# Patient Record
Sex: Female | Born: 1995
Health system: Southern US, Community
[De-identification: ages and names within clinical notes are randomized; demographics above are authoritative.]

## PROBLEM LIST (undated history)

## (undated) DIAGNOSIS — Z8489 Family history of other specified conditions: Secondary | ICD-10-CM

## (undated) DIAGNOSIS — E079 Disorder of thyroid, unspecified: Secondary | ICD-10-CM

---
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
## 2017-05-16 DIAGNOSIS — Z111 Encounter for screening for respiratory tuberculosis: Secondary | ICD-10-CM | POA: Diagnosis not present

## 2017-05-16 DIAGNOSIS — Z020 Encounter for examination for admission to educational institution: Secondary | ICD-10-CM | POA: Diagnosis not present

## 2017-06-13 DIAGNOSIS — Z13220 Encounter for screening for lipoid disorders: Secondary | ICD-10-CM | POA: Diagnosis not present

## 2017-06-13 DIAGNOSIS — Z23 Encounter for immunization: Secondary | ICD-10-CM | POA: Diagnosis not present

## 2017-06-13 DIAGNOSIS — R946 Abnormal results of thyroid function studies: Secondary | ICD-10-CM | POA: Diagnosis not present

## 2017-06-13 DIAGNOSIS — Z0000 Encounter for general adult medical examination without abnormal findings: Secondary | ICD-10-CM | POA: Diagnosis not present

## 2017-06-19 DIAGNOSIS — R946 Abnormal results of thyroid function studies: Secondary | ICD-10-CM | POA: Diagnosis not present

## 2017-11-08 DIAGNOSIS — F321 Major depressive disorder, single episode, moderate: Secondary | ICD-10-CM | POA: Diagnosis not present

## 2017-11-15 DIAGNOSIS — F321 Major depressive disorder, single episode, moderate: Secondary | ICD-10-CM | POA: Diagnosis not present

## 2017-11-28 DIAGNOSIS — F321 Major depressive disorder, single episode, moderate: Secondary | ICD-10-CM | POA: Diagnosis not present

## 2017-12-17 DIAGNOSIS — F321 Major depressive disorder, single episode, moderate: Secondary | ICD-10-CM | POA: Diagnosis not present

## 2018-01-07 DIAGNOSIS — F4321 Adjustment disorder with depressed mood: Secondary | ICD-10-CM | POA: Diagnosis not present

## 2018-01-09 DIAGNOSIS — F4321 Adjustment disorder with depressed mood: Secondary | ICD-10-CM | POA: Diagnosis not present

## 2018-01-16 DIAGNOSIS — F4321 Adjustment disorder with depressed mood: Secondary | ICD-10-CM | POA: Diagnosis not present

## 2018-01-23 DIAGNOSIS — R946 Abnormal results of thyroid function studies: Secondary | ICD-10-CM | POA: Diagnosis not present

## 2018-01-23 DIAGNOSIS — R5383 Other fatigue: Secondary | ICD-10-CM | POA: Diagnosis not present

## 2018-01-23 DIAGNOSIS — E6609 Other obesity due to excess calories: Secondary | ICD-10-CM | POA: Diagnosis not present

## 2018-01-23 DIAGNOSIS — N926 Irregular menstruation, unspecified: Secondary | ICD-10-CM | POA: Diagnosis not present

## 2018-01-30 DIAGNOSIS — F4321 Adjustment disorder with depressed mood: Secondary | ICD-10-CM | POA: Diagnosis not present

## 2018-02-08 DIAGNOSIS — F4321 Adjustment disorder with depressed mood: Secondary | ICD-10-CM | POA: Diagnosis not present

## 2018-02-13 DIAGNOSIS — F4321 Adjustment disorder with depressed mood: Secondary | ICD-10-CM | POA: Diagnosis not present

## 2018-03-01 DIAGNOSIS — F4321 Adjustment disorder with depressed mood: Secondary | ICD-10-CM | POA: Diagnosis not present

## 2018-03-11 DIAGNOSIS — F4321 Adjustment disorder with depressed mood: Secondary | ICD-10-CM | POA: Diagnosis not present

## 2018-05-28 DIAGNOSIS — F4321 Adjustment disorder with depressed mood: Secondary | ICD-10-CM | POA: Diagnosis not present

## 2018-06-07 DIAGNOSIS — F4321 Adjustment disorder with depressed mood: Secondary | ICD-10-CM | POA: Diagnosis not present

## 2018-06-17 ENCOUNTER — Other Ambulatory Visit (HOSPITAL_COMMUNITY)
Admission: RE | Admit: 2018-06-17 | Discharge: 2018-06-17 | Disposition: A | Discharge status: Home / Self Care | Payer: BC Managed Care – PPO | Source: Ambulatory Visit | Attending: Family Medicine | Admitting: Family Medicine

## 2018-06-17 ENCOUNTER — Other Ambulatory Visit: Payer: Self-pay | Admitting: Family Medicine

## 2018-06-17 DIAGNOSIS — Z13220 Encounter for screening for lipoid disorders: Secondary | ICD-10-CM | POA: Diagnosis not present

## 2018-06-17 DIAGNOSIS — N912 Amenorrhea, unspecified: Secondary | ICD-10-CM | POA: Diagnosis not present

## 2018-06-17 DIAGNOSIS — Z0000 Encounter for general adult medical examination without abnormal findings: Secondary | ICD-10-CM | POA: Diagnosis not present

## 2018-06-17 DIAGNOSIS — Z124 Encounter for screening for malignant neoplasm of cervix: Secondary | ICD-10-CM | POA: Insufficient documentation

## 2018-06-17 DIAGNOSIS — R946 Abnormal results of thyroid function studies: Secondary | ICD-10-CM | POA: Diagnosis not present

## 2018-06-17 DIAGNOSIS — Z111 Encounter for screening for respiratory tuberculosis: Secondary | ICD-10-CM | POA: Diagnosis not present

## 2018-06-17 DIAGNOSIS — R7989 Other specified abnormal findings of blood chemistry: Secondary | ICD-10-CM | POA: Diagnosis not present

## 2018-06-20 LAB — CYTOLOGY - PAP: Diagnosis: NEGATIVE

## 2018-06-25 DIAGNOSIS — F4321 Adjustment disorder with depressed mood: Secondary | ICD-10-CM | POA: Diagnosis not present

## 2018-07-02 DIAGNOSIS — F4321 Adjustment disorder with depressed mood: Secondary | ICD-10-CM | POA: Diagnosis not present

## 2018-07-16 DIAGNOSIS — R635 Abnormal weight gain: Secondary | ICD-10-CM | POA: Diagnosis not present

## 2018-07-16 DIAGNOSIS — E282 Polycystic ovarian syndrome: Secondary | ICD-10-CM | POA: Diagnosis not present

## 2018-07-16 DIAGNOSIS — N912 Amenorrhea, unspecified: Secondary | ICD-10-CM | POA: Diagnosis not present

## 2018-07-17 DIAGNOSIS — F4321 Adjustment disorder with depressed mood: Secondary | ICD-10-CM | POA: Diagnosis not present

## 2018-07-29 DIAGNOSIS — F4321 Adjustment disorder with depressed mood: Secondary | ICD-10-CM | POA: Diagnosis not present

## 2018-08-13 DIAGNOSIS — F4321 Adjustment disorder with depressed mood: Secondary | ICD-10-CM | POA: Diagnosis not present

## 2018-09-19 DIAGNOSIS — F4321 Adjustment disorder with depressed mood: Secondary | ICD-10-CM | POA: Diagnosis not present

## 2018-10-03 DIAGNOSIS — F4321 Adjustment disorder with depressed mood: Secondary | ICD-10-CM | POA: Diagnosis not present

## 2018-10-23 DIAGNOSIS — F4321 Adjustment disorder with depressed mood: Secondary | ICD-10-CM | POA: Diagnosis not present

## 2018-11-06 DIAGNOSIS — F4321 Adjustment disorder with depressed mood: Secondary | ICD-10-CM | POA: Diagnosis not present

## 2018-11-20 DIAGNOSIS — R7989 Other specified abnormal findings of blood chemistry: Secondary | ICD-10-CM | POA: Diagnosis not present

## 2018-11-20 DIAGNOSIS — E669 Obesity, unspecified: Secondary | ICD-10-CM | POA: Diagnosis not present

## 2018-11-20 DIAGNOSIS — E221 Hyperprolactinemia: Secondary | ICD-10-CM | POA: Diagnosis not present

## 2018-11-20 DIAGNOSIS — N912 Amenorrhea, unspecified: Secondary | ICD-10-CM | POA: Diagnosis not present

## 2018-11-20 DIAGNOSIS — E282 Polycystic ovarian syndrome: Secondary | ICD-10-CM | POA: Diagnosis not present

## 2018-11-20 DIAGNOSIS — E02 Subclinical iodine-deficiency hypothyroidism: Secondary | ICD-10-CM | POA: Diagnosis not present

## 2018-11-25 ENCOUNTER — Other Ambulatory Visit: Payer: Self-pay | Admitting: Endocrinology

## 2018-11-25 ENCOUNTER — Other Ambulatory Visit: Payer: Self-pay | Admitting: Nurse Practitioner

## 2018-11-26 ENCOUNTER — Other Ambulatory Visit: Payer: Self-pay | Admitting: Endocrinology

## 2018-11-26 DIAGNOSIS — E221 Hyperprolactinemia: Secondary | ICD-10-CM

## 2018-12-04 ENCOUNTER — Other Ambulatory Visit: Payer: Self-pay

## 2018-12-04 DIAGNOSIS — Z20822 Contact with and (suspected) exposure to covid-19: Secondary | ICD-10-CM

## 2018-12-04 DIAGNOSIS — F4321 Adjustment disorder with depressed mood: Secondary | ICD-10-CM | POA: Diagnosis not present

## 2018-12-06 LAB — NOVEL CORONAVIRUS, NAA: SARS-CoV-2, NAA: NOT DETECTED

## 2018-12-11 DIAGNOSIS — F4321 Adjustment disorder with depressed mood: Secondary | ICD-10-CM | POA: Diagnosis not present

## 2018-12-13 ENCOUNTER — Ambulatory Visit
Admission: RE | Admit: 2018-12-13 | Discharge: 2018-12-13 | Disposition: A | Discharge status: Home / Self Care | Payer: BC Managed Care – PPO | Source: Ambulatory Visit | Attending: Endocrinology | Admitting: Endocrinology

## 2018-12-13 ENCOUNTER — Other Ambulatory Visit: Payer: Self-pay

## 2018-12-13 DIAGNOSIS — E221 Hyperprolactinemia: Secondary | ICD-10-CM

## 2018-12-13 DIAGNOSIS — R519 Headache, unspecified: Secondary | ICD-10-CM | POA: Diagnosis not present

## 2019-01-20 DIAGNOSIS — F4321 Adjustment disorder with depressed mood: Secondary | ICD-10-CM | POA: Diagnosis not present

## 2019-01-30 DIAGNOSIS — F4321 Adjustment disorder with depressed mood: Secondary | ICD-10-CM | POA: Diagnosis not present

## 2019-02-17 DIAGNOSIS — F4321 Adjustment disorder with depressed mood: Secondary | ICD-10-CM | POA: Diagnosis not present

## 2019-03-05 DIAGNOSIS — F4321 Adjustment disorder with depressed mood: Secondary | ICD-10-CM | POA: Diagnosis not present

## 2019-05-02 DIAGNOSIS — Z20828 Contact with and (suspected) exposure to other viral communicable diseases: Secondary | ICD-10-CM | POA: Diagnosis not present

## 2019-05-02 DIAGNOSIS — Z03818 Encounter for observation for suspected exposure to other biological agents ruled out: Secondary | ICD-10-CM | POA: Diagnosis not present

## 2019-05-05 DIAGNOSIS — R5382 Chronic fatigue, unspecified: Secondary | ICD-10-CM | POA: Diagnosis not present

## 2019-05-05 DIAGNOSIS — G44209 Tension-type headache, unspecified, not intractable: Secondary | ICD-10-CM | POA: Diagnosis not present

## 2019-05-05 DIAGNOSIS — F439 Reaction to severe stress, unspecified: Secondary | ICD-10-CM | POA: Diagnosis not present

## 2019-05-05 DIAGNOSIS — G43909 Migraine, unspecified, not intractable, without status migrainosus: Secondary | ICD-10-CM | POA: Diagnosis not present

## 2019-05-21 DIAGNOSIS — E02 Subclinical iodine-deficiency hypothyroidism: Secondary | ICD-10-CM | POA: Diagnosis not present

## 2019-05-21 DIAGNOSIS — E221 Hyperprolactinemia: Secondary | ICD-10-CM | POA: Diagnosis not present

## 2019-05-21 DIAGNOSIS — E282 Polycystic ovarian syndrome: Secondary | ICD-10-CM | POA: Diagnosis not present

## 2019-05-26 DIAGNOSIS — R519 Headache, unspecified: Secondary | ICD-10-CM | POA: Diagnosis not present

## 2019-05-27 ENCOUNTER — Other Ambulatory Visit: Payer: Self-pay | Admitting: Family Medicine

## 2019-05-27 ENCOUNTER — Other Ambulatory Visit: Payer: Self-pay

## 2019-05-27 DIAGNOSIS — R519 Headache, unspecified: Secondary | ICD-10-CM

## 2019-05-28 ENCOUNTER — Other Ambulatory Visit: Payer: Self-pay | Admitting: Family Medicine

## 2019-05-28 DIAGNOSIS — F4321 Adjustment disorder with depressed mood: Secondary | ICD-10-CM | POA: Diagnosis not present

## 2019-06-03 DIAGNOSIS — F4321 Adjustment disorder with depressed mood: Secondary | ICD-10-CM | POA: Diagnosis not present

## 2019-06-18 DIAGNOSIS — F4321 Adjustment disorder with depressed mood: Secondary | ICD-10-CM | POA: Diagnosis not present

## 2019-06-19 DIAGNOSIS — E559 Vitamin D deficiency, unspecified: Secondary | ICD-10-CM | POA: Diagnosis not present

## 2019-06-19 DIAGNOSIS — E538 Deficiency of other specified B group vitamins: Secondary | ICD-10-CM | POA: Diagnosis not present

## 2019-06-19 DIAGNOSIS — Z0000 Encounter for general adult medical examination without abnormal findings: Secondary | ICD-10-CM | POA: Diagnosis not present

## 2019-06-19 DIAGNOSIS — Z13220 Encounter for screening for lipoid disorders: Secondary | ICD-10-CM | POA: Diagnosis not present

## 2019-06-28 ENCOUNTER — Ambulatory Visit
Admission: RE | Admit: 2019-06-28 | Discharge: 2019-06-28 | Disposition: A | Discharge status: Home / Self Care | Payer: BC Managed Care – PPO | Source: Ambulatory Visit | Attending: Family Medicine | Admitting: Family Medicine

## 2019-06-28 ENCOUNTER — Other Ambulatory Visit: Payer: Self-pay

## 2019-06-28 DIAGNOSIS — R519 Headache, unspecified: Secondary | ICD-10-CM

## 2019-08-01 DIAGNOSIS — F4321 Adjustment disorder with depressed mood: Secondary | ICD-10-CM | POA: Diagnosis not present

## 2019-08-15 DIAGNOSIS — F4321 Adjustment disorder with depressed mood: Secondary | ICD-10-CM | POA: Diagnosis not present

## 2019-08-29 DIAGNOSIS — F4321 Adjustment disorder with depressed mood: Secondary | ICD-10-CM | POA: Diagnosis not present

## 2019-09-12 DIAGNOSIS — F4321 Adjustment disorder with depressed mood: Secondary | ICD-10-CM | POA: Diagnosis not present

## 2019-09-22 DIAGNOSIS — Z111 Encounter for screening for respiratory tuberculosis: Secondary | ICD-10-CM | POA: Diagnosis not present

## 2019-10-23 DIAGNOSIS — F4321 Adjustment disorder with depressed mood: Secondary | ICD-10-CM | POA: Diagnosis not present

## 2019-10-29 DIAGNOSIS — F4321 Adjustment disorder with depressed mood: Secondary | ICD-10-CM | POA: Diagnosis not present

## 2019-11-10 DIAGNOSIS — F3111 Bipolar disorder, current episode manic without psychotic features, mild: Secondary | ICD-10-CM | POA: Diagnosis not present

## 2019-11-10 DIAGNOSIS — F900 Attention-deficit hyperactivity disorder, predominantly inattentive type: Secondary | ICD-10-CM | POA: Diagnosis not present

## 2019-11-13 DIAGNOSIS — F4321 Adjustment disorder with depressed mood: Secondary | ICD-10-CM | POA: Diagnosis not present

## 2019-12-02 DIAGNOSIS — F4321 Adjustment disorder with depressed mood: Secondary | ICD-10-CM | POA: Diagnosis not present

## 2019-12-25 ENCOUNTER — Encounter (HOSPITAL_COMMUNITY): Payer: Self-pay

## 2019-12-25 ENCOUNTER — Other Ambulatory Visit: Payer: Self-pay

## 2019-12-25 ENCOUNTER — Emergency Department (HOSPITAL_COMMUNITY)
Admission: EM | Admit: 2019-12-25 | Discharge: 2019-12-25 | Disposition: A | Discharge status: Home / Self Care | Payer: BC Managed Care – PPO | Attending: Emergency Medicine | Admitting: Emergency Medicine

## 2019-12-25 ENCOUNTER — Emergency Department (HOSPITAL_COMMUNITY): Payer: BC Managed Care – PPO

## 2019-12-25 ENCOUNTER — Emergency Department (HOSPITAL_COMMUNITY)
Admission: EM | Admit: 2019-12-25 | Discharge: 2019-12-26 | Disposition: A | Discharge status: Home / Self Care | Payer: BC Managed Care – PPO | Source: Home / Self Care | Attending: Emergency Medicine | Admitting: Emergency Medicine

## 2019-12-25 DIAGNOSIS — S0500XA Injury of conjunctiva and corneal abrasion without foreign body, unspecified eye, initial encounter: Secondary | ICD-10-CM | POA: Insufficient documentation

## 2019-12-25 DIAGNOSIS — M542 Cervicalgia: Secondary | ICD-10-CM | POA: Insufficient documentation

## 2019-12-25 DIAGNOSIS — R42 Dizziness and giddiness: Secondary | ICD-10-CM | POA: Diagnosis not present

## 2019-12-25 DIAGNOSIS — R1012 Left upper quadrant pain: Secondary | ICD-10-CM | POA: Insufficient documentation

## 2019-12-25 DIAGNOSIS — F419 Anxiety disorder, unspecified: Secondary | ICD-10-CM | POA: Diagnosis not present

## 2019-12-25 DIAGNOSIS — S0502XA Injury of conjunctiva and corneal abrasion without foreign body, left eye, initial encounter: Secondary | ICD-10-CM

## 2019-12-25 DIAGNOSIS — S0990XA Unspecified injury of head, initial encounter: Secondary | ICD-10-CM | POA: Diagnosis not present

## 2019-12-25 DIAGNOSIS — M549 Dorsalgia, unspecified: Secondary | ICD-10-CM | POA: Insufficient documentation

## 2019-12-25 DIAGNOSIS — Y92410 Unspecified street and highway as the place of occurrence of the external cause: Secondary | ICD-10-CM | POA: Insufficient documentation

## 2019-12-25 DIAGNOSIS — H5712 Ocular pain, left eye: Secondary | ICD-10-CM | POA: Diagnosis not present

## 2019-12-25 HISTORY — DX: Disorder of thyroid, unspecified: E07.9

## 2019-12-25 MED ORDER — TETRACAINE HCL 0.5 % OP SOLN
2.0000 [drp] | Freq: Once | OPHTHALMIC | Status: AC
  Administered 2019-12-25: 2 [drp] via OPHTHALMIC
  Filled 2019-12-25: qty 4

## 2019-12-25 MED ORDER — FLUORESCEIN SODIUM 1 MG OP STRP
1.0000 | ORAL_STRIP | Freq: Once | OPHTHALMIC | Status: AC
  Administered 2019-12-25: 1 via OPHTHALMIC
  Filled 2019-12-25: qty 1

## 2019-12-25 MED ORDER — METHOCARBAMOL 500 MG PO TABS: 500.0000 mg | ORAL_TABLET | Freq: Three times a day (TID) | ORAL | 0 refills | Status: DC | PRN
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
## 2019-12-26 ENCOUNTER — Emergency Department (HOSPITAL_COMMUNITY): Payer: BC Managed Care – PPO

## 2019-12-26 DIAGNOSIS — H5712 Ocular pain, left eye: Secondary | ICD-10-CM | POA: Diagnosis not present

## 2019-12-26 DIAGNOSIS — S0990XA Unspecified injury of head, initial encounter: Secondary | ICD-10-CM | POA: Diagnosis not present

## 2019-12-26 DIAGNOSIS — R42 Dizziness and giddiness: Secondary | ICD-10-CM | POA: Diagnosis not present

## 2019-12-26 MED ORDER — KETOROLAC TROMETHAMINE 0.5 % OP SOLN: 1.0000 [drp] | Freq: Four times a day (QID) | OPHTHALMIC | 0 refills | Status: DC

## 2019-12-26 MED ORDER — OFLOXACIN 0.3 % OP SOLN: 2.0000 [drp] | Freq: Four times a day (QID) | OPHTHALMIC | 0 refills | Status: AC

## 2019-12-26 MED ORDER — OFLOXACIN 0.3 % OP SOLN
2.0000 [drp] | Freq: Four times a day (QID) | OPHTHALMIC | Status: DC
  Administered 2019-12-26: 2 [drp] via OPHTHALMIC
  Filled 2019-12-26: qty 5

## 2019-12-26 MED ORDER — KETOROLAC TROMETHAMINE 60 MG/2ML IM SOLN
30.0000 mg | Freq: Once | INTRAMUSCULAR | Status: AC
  Administered 2019-12-26: 30 mg via INTRAMUSCULAR
  Filled 2019-12-26: qty 2
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
## 2019-12-26 NOTE — ED Notes (Signed)
Visual Acuity- Right eye @20  ft 20/200.                         Left eye @20  ft 20/100                        Both eyes at 11ft 20/70
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
## 2019-12-26 NOTE — Discharge Instructions (Addendum)
To use your eye drops for the next 5 days please place two drops in your left eye 4 times a day.  Do not wear your contacts for two weeks or until your eye doctor tells you that you can.   Please call your eye doctor to schedule follow up (hopefully in the next 1-2 days.)  If your doctor can't see you then you may call the office of Dr. Sherrine Maples.    Please take Ibuprofen (Advil, motrin) and Tylenol (acetaminophen) to relieve your pain.  You may take up to 600 MG (3 pills) of normal strength ibuprofen every 8 hours as needed.  In between doses of ibuprofen you make take tylenol, up to 1,000 mg (two extra strength pills).  Do not take more than 3,000 mg tylenol in a 24 hour period.  Please check all medication labels as many medications such as pain and cold medications may contain tylenol.  Do not drink alcohol while taking these medications.  Do not take other NSAID'S while taking ibuprofen (such as aleve or naproxen).  Please take ibuprofen with food to decrease stomach upset.  You can use the Robaxin that was prescribed at your ER visit earlier today as prescribed on the label.  Return to the emergency department if you develop new or worsening symptoms, including shortness of breath, significant changes in your vision, if you pass out, have blood in your urine, or develop other new, concerning symptoms.

## 2019-12-30 DIAGNOSIS — H20012 Primary iridocyclitis, left eye: Secondary | ICD-10-CM | POA: Diagnosis not present

## 2020-01-08 DIAGNOSIS — S0512XD Contusion of eyeball and orbital tissues, left eye, subsequent encounter: Secondary | ICD-10-CM | POA: Diagnosis not present

## 2020-01-08 DIAGNOSIS — F419 Anxiety disorder, unspecified: Secondary | ICD-10-CM | POA: Diagnosis not present

## 2020-02-10 DIAGNOSIS — F3181 Bipolar II disorder: Secondary | ICD-10-CM | POA: Diagnosis not present

## 2020-02-18 DIAGNOSIS — M5386 Other specified dorsopathies, lumbar region: Secondary | ICD-10-CM | POA: Diagnosis not present

## 2020-02-18 DIAGNOSIS — M5382 Other specified dorsopathies, cervical region: Secondary | ICD-10-CM | POA: Diagnosis not present

## 2020-02-18 DIAGNOSIS — M9901 Segmental and somatic dysfunction of cervical region: Secondary | ICD-10-CM | POA: Diagnosis not present

## 2020-02-18 DIAGNOSIS — M9903 Segmental and somatic dysfunction of lumbar region: Secondary | ICD-10-CM | POA: Diagnosis not present

## 2020-02-19 DIAGNOSIS — M5382 Other specified dorsopathies, cervical region: Secondary | ICD-10-CM | POA: Diagnosis not present

## 2020-02-19 DIAGNOSIS — M5386 Other specified dorsopathies, lumbar region: Secondary | ICD-10-CM | POA: Diagnosis not present

## 2020-02-19 DIAGNOSIS — M9903 Segmental and somatic dysfunction of lumbar region: Secondary | ICD-10-CM | POA: Diagnosis not present

## 2020-02-19 DIAGNOSIS — M9901 Segmental and somatic dysfunction of cervical region: Secondary | ICD-10-CM | POA: Diagnosis not present

## 2020-02-23 DIAGNOSIS — M9901 Segmental and somatic dysfunction of cervical region: Secondary | ICD-10-CM | POA: Diagnosis not present

## 2020-02-23 DIAGNOSIS — M9903 Segmental and somatic dysfunction of lumbar region: Secondary | ICD-10-CM | POA: Diagnosis not present

## 2020-02-23 DIAGNOSIS — M5382 Other specified dorsopathies, cervical region: Secondary | ICD-10-CM | POA: Diagnosis not present

## 2020-02-23 DIAGNOSIS — M5386 Other specified dorsopathies, lumbar region: Secondary | ICD-10-CM | POA: Diagnosis not present

## 2020-02-24 DIAGNOSIS — F3181 Bipolar II disorder: Secondary | ICD-10-CM | POA: Diagnosis not present

## 2020-02-25 DIAGNOSIS — M5386 Other specified dorsopathies, lumbar region: Secondary | ICD-10-CM | POA: Diagnosis not present

## 2020-02-25 DIAGNOSIS — M9903 Segmental and somatic dysfunction of lumbar region: Secondary | ICD-10-CM | POA: Diagnosis not present

## 2020-02-25 DIAGNOSIS — M9901 Segmental and somatic dysfunction of cervical region: Secondary | ICD-10-CM | POA: Diagnosis not present

## 2020-02-25 DIAGNOSIS — M5382 Other specified dorsopathies, cervical region: Secondary | ICD-10-CM | POA: Diagnosis not present

## 2020-02-26 DIAGNOSIS — M5386 Other specified dorsopathies, lumbar region: Secondary | ICD-10-CM | POA: Diagnosis not present

## 2020-02-26 DIAGNOSIS — M9903 Segmental and somatic dysfunction of lumbar region: Secondary | ICD-10-CM | POA: Diagnosis not present

## 2020-02-26 DIAGNOSIS — M9901 Segmental and somatic dysfunction of cervical region: Secondary | ICD-10-CM | POA: Diagnosis not present

## 2020-02-26 DIAGNOSIS — M5382 Other specified dorsopathies, cervical region: Secondary | ICD-10-CM | POA: Diagnosis not present

## 2020-03-08 DIAGNOSIS — M5382 Other specified dorsopathies, cervical region: Secondary | ICD-10-CM | POA: Diagnosis not present

## 2020-03-08 DIAGNOSIS — M9901 Segmental and somatic dysfunction of cervical region: Secondary | ICD-10-CM | POA: Diagnosis not present

## 2020-03-08 DIAGNOSIS — M9903 Segmental and somatic dysfunction of lumbar region: Secondary | ICD-10-CM | POA: Diagnosis not present

## 2020-03-08 DIAGNOSIS — M5386 Other specified dorsopathies, lumbar region: Secondary | ICD-10-CM | POA: Diagnosis not present

## 2020-03-09 DIAGNOSIS — M5386 Other specified dorsopathies, lumbar region: Secondary | ICD-10-CM | POA: Diagnosis not present

## 2020-03-09 DIAGNOSIS — M9901 Segmental and somatic dysfunction of cervical region: Secondary | ICD-10-CM | POA: Diagnosis not present

## 2020-03-09 DIAGNOSIS — M5382 Other specified dorsopathies, cervical region: Secondary | ICD-10-CM | POA: Diagnosis not present

## 2020-03-09 DIAGNOSIS — M9903 Segmental and somatic dysfunction of lumbar region: Secondary | ICD-10-CM | POA: Diagnosis not present

## 2020-03-11 DIAGNOSIS — M9903 Segmental and somatic dysfunction of lumbar region: Secondary | ICD-10-CM | POA: Diagnosis not present

## 2020-03-11 DIAGNOSIS — M5382 Other specified dorsopathies, cervical region: Secondary | ICD-10-CM | POA: Diagnosis not present

## 2020-03-11 DIAGNOSIS — M9901 Segmental and somatic dysfunction of cervical region: Secondary | ICD-10-CM | POA: Diagnosis not present

## 2020-03-11 DIAGNOSIS — M5386 Other specified dorsopathies, lumbar region: Secondary | ICD-10-CM | POA: Diagnosis not present

## 2020-03-16 DIAGNOSIS — M5386 Other specified dorsopathies, lumbar region: Secondary | ICD-10-CM | POA: Diagnosis not present

## 2020-03-16 DIAGNOSIS — M5382 Other specified dorsopathies, cervical region: Secondary | ICD-10-CM | POA: Diagnosis not present

## 2020-03-16 DIAGNOSIS — M9903 Segmental and somatic dysfunction of lumbar region: Secondary | ICD-10-CM | POA: Diagnosis not present

## 2020-03-16 DIAGNOSIS — M9901 Segmental and somatic dysfunction of cervical region: Secondary | ICD-10-CM | POA: Diagnosis not present

## 2020-03-18 DIAGNOSIS — M9903 Segmental and somatic dysfunction of lumbar region: Secondary | ICD-10-CM | POA: Diagnosis not present

## 2020-03-18 DIAGNOSIS — M5386 Other specified dorsopathies, lumbar region: Secondary | ICD-10-CM | POA: Diagnosis not present

## 2020-03-18 DIAGNOSIS — M9901 Segmental and somatic dysfunction of cervical region: Secondary | ICD-10-CM | POA: Diagnosis not present

## 2020-03-18 DIAGNOSIS — M5382 Other specified dorsopathies, cervical region: Secondary | ICD-10-CM | POA: Diagnosis not present

## 2020-06-16 DIAGNOSIS — F331 Major depressive disorder, recurrent, moderate: Secondary | ICD-10-CM | POA: Diagnosis not present

## 2020-07-01 DIAGNOSIS — F331 Major depressive disorder, recurrent, moderate: Secondary | ICD-10-CM | POA: Diagnosis not present

## 2020-07-07 DIAGNOSIS — F331 Major depressive disorder, recurrent, moderate: Secondary | ICD-10-CM | POA: Diagnosis not present

## 2020-07-29 DIAGNOSIS — R2232 Localized swelling, mass and lump, left upper limb: Secondary | ICD-10-CM | POA: Diagnosis not present

## 2020-08-03 ENCOUNTER — Other Ambulatory Visit: Payer: Self-pay | Admitting: Physician Assistant

## 2020-08-03 DIAGNOSIS — R2232 Localized swelling, mass and lump, left upper limb: Secondary | ICD-10-CM

## 2020-08-18 DIAGNOSIS — F331 Major depressive disorder, recurrent, moderate: Secondary | ICD-10-CM | POA: Diagnosis not present

## 2020-08-25 ENCOUNTER — Other Ambulatory Visit: Payer: Self-pay

## 2020-08-25 ENCOUNTER — Ambulatory Visit
Admission: RE | Admit: 2020-08-25 | Discharge: 2020-08-25 | Disposition: A | Discharge status: Home / Self Care | Payer: BC Managed Care – PPO | Source: Ambulatory Visit | Attending: Physician Assistant | Admitting: Physician Assistant

## 2020-08-25 DIAGNOSIS — M79622 Pain in left upper arm: Secondary | ICD-10-CM | POA: Diagnosis not present

## 2020-08-25 DIAGNOSIS — R2232 Localized swelling, mass and lump, left upper limb: Secondary | ICD-10-CM

## 2020-08-27 ENCOUNTER — Other Ambulatory Visit: Payer: BC Managed Care – PPO

## 2020-09-28 DIAGNOSIS — F331 Major depressive disorder, recurrent, moderate: Secondary | ICD-10-CM | POA: Diagnosis not present

## 2020-10-12 DIAGNOSIS — F900 Attention-deficit hyperactivity disorder, predominantly inattentive type: Secondary | ICD-10-CM | POA: Diagnosis not present

## 2020-10-12 DIAGNOSIS — F3181 Bipolar II disorder: Secondary | ICD-10-CM | POA: Diagnosis not present

## 2020-10-22 DIAGNOSIS — F331 Major depressive disorder, recurrent, moderate: Secondary | ICD-10-CM | POA: Diagnosis not present

## 2020-10-27 DIAGNOSIS — F3181 Bipolar II disorder: Secondary | ICD-10-CM | POA: Diagnosis not present

## 2020-10-27 DIAGNOSIS — F900 Attention-deficit hyperactivity disorder, predominantly inattentive type: Secondary | ICD-10-CM | POA: Diagnosis not present

## 2020-11-03 DIAGNOSIS — E559 Vitamin D deficiency, unspecified: Secondary | ICD-10-CM | POA: Diagnosis not present

## 2020-11-03 DIAGNOSIS — E538 Deficiency of other specified B group vitamins: Secondary | ICD-10-CM | POA: Diagnosis not present

## 2020-11-03 DIAGNOSIS — E039 Hypothyroidism, unspecified: Secondary | ICD-10-CM | POA: Diagnosis not present

## 2020-11-24 DIAGNOSIS — F3181 Bipolar II disorder: Secondary | ICD-10-CM | POA: Diagnosis not present

## 2020-11-24 DIAGNOSIS — F900 Attention-deficit hyperactivity disorder, predominantly inattentive type: Secondary | ICD-10-CM | POA: Diagnosis not present

## 2020-12-03 DIAGNOSIS — R110 Nausea: Secondary | ICD-10-CM | POA: Diagnosis not present

## 2020-12-03 DIAGNOSIS — R1013 Epigastric pain: Secondary | ICD-10-CM | POA: Diagnosis not present

## 2020-12-03 DIAGNOSIS — K5900 Constipation, unspecified: Secondary | ICD-10-CM | POA: Diagnosis not present

## 2020-12-13 DIAGNOSIS — K5900 Constipation, unspecified: Secondary | ICD-10-CM | POA: Diagnosis not present

## 2020-12-13 DIAGNOSIS — R1013 Epigastric pain: Secondary | ICD-10-CM | POA: Diagnosis not present

## 2020-12-13 DIAGNOSIS — F900 Attention-deficit hyperactivity disorder, predominantly inattentive type: Secondary | ICD-10-CM | POA: Diagnosis not present

## 2020-12-13 DIAGNOSIS — F3181 Bipolar II disorder: Secondary | ICD-10-CM | POA: Diagnosis not present

## 2021-01-07 DIAGNOSIS — F331 Major depressive disorder, recurrent, moderate: Secondary | ICD-10-CM | POA: Diagnosis not present

## 2021-01-14 DIAGNOSIS — F331 Major depressive disorder, recurrent, moderate: Secondary | ICD-10-CM | POA: Diagnosis not present

## 2021-01-25 DIAGNOSIS — F331 Major depressive disorder, recurrent, moderate: Secondary | ICD-10-CM | POA: Diagnosis not present

## 2021-01-27 DIAGNOSIS — Z20822 Contact with and (suspected) exposure to covid-19: Secondary | ICD-10-CM | POA: Diagnosis not present

## 2021-02-01 DIAGNOSIS — F331 Major depressive disorder, recurrent, moderate: Secondary | ICD-10-CM | POA: Diagnosis not present

## 2021-02-09 DIAGNOSIS — F331 Major depressive disorder, recurrent, moderate: Secondary | ICD-10-CM | POA: Diagnosis not present

## 2021-02-10 DIAGNOSIS — F900 Attention-deficit hyperactivity disorder, predominantly inattentive type: Secondary | ICD-10-CM | POA: Diagnosis not present

## 2021-02-10 DIAGNOSIS — F3181 Bipolar II disorder: Secondary | ICD-10-CM | POA: Diagnosis not present

## 2021-02-24 DIAGNOSIS — F331 Major depressive disorder, recurrent, moderate: Secondary | ICD-10-CM | POA: Diagnosis not present

## 2021-03-18 ENCOUNTER — Emergency Department (HOSPITAL_COMMUNITY)
Admission: EM | Admit: 2021-03-18 | Discharge: 2021-03-18 | Disposition: A | Discharge status: Home / Self Care | Payer: BC Managed Care – PPO | Attending: Emergency Medicine | Admitting: Emergency Medicine

## 2021-03-18 ENCOUNTER — Other Ambulatory Visit: Payer: Self-pay

## 2021-03-18 ENCOUNTER — Emergency Department (HOSPITAL_COMMUNITY): Payer: BC Managed Care – PPO

## 2021-03-18 ENCOUNTER — Emergency Department (HOSPITAL_BASED_OUTPATIENT_CLINIC_OR_DEPARTMENT_OTHER): Payer: BC Managed Care – PPO

## 2021-03-18 ENCOUNTER — Encounter (HOSPITAL_COMMUNITY): Payer: Self-pay

## 2021-03-18 DIAGNOSIS — M79604 Pain in right leg: Secondary | ICD-10-CM

## 2021-03-18 DIAGNOSIS — R29818 Other symptoms and signs involving the nervous system: Secondary | ICD-10-CM | POA: Diagnosis not present

## 2021-03-18 DIAGNOSIS — E876 Hypokalemia: Secondary | ICD-10-CM | POA: Insufficient documentation

## 2021-03-18 DIAGNOSIS — R110 Nausea: Secondary | ICD-10-CM | POA: Insufficient documentation

## 2021-03-18 DIAGNOSIS — R42 Dizziness and giddiness: Secondary | ICD-10-CM | POA: Diagnosis not present

## 2021-03-18 DIAGNOSIS — M79661 Pain in right lower leg: Secondary | ICD-10-CM | POA: Diagnosis not present

## 2021-03-18 DIAGNOSIS — M6281 Muscle weakness (generalized): Secondary | ICD-10-CM | POA: Insufficient documentation

## 2021-03-18 DIAGNOSIS — R531 Weakness: Secondary | ICD-10-CM | POA: Diagnosis not present

## 2021-03-18 LAB — CBC WITH DIFFERENTIAL/PLATELET
Abs Immature Granulocytes: 0.02 10*3/uL (ref 0.00–0.07)
Basophils Absolute: 0.1 10*3/uL (ref 0.0–0.1)
Basophils Relative: 1 %
Eosinophils Absolute: 0.1 10*3/uL (ref 0.0–0.5)
Eosinophils Relative: 1 %
HCT: 37.1 % (ref 36.0–46.0)
Hemoglobin: 12.2 g/dL (ref 12.0–15.0)
Immature Granulocytes: 0 %
Lymphocytes Relative: 30 %
Lymphs Abs: 2.5 10*3/uL (ref 0.7–4.0)
MCH: 30.7 pg (ref 26.0–34.0)
MCHC: 32.9 g/dL (ref 30.0–36.0)
MCV: 93.5 fL (ref 80.0–100.0)
Monocytes Absolute: 0.7 10*3/uL (ref 0.1–1.0)
Monocytes Relative: 8 %
Neutro Abs: 4.9 10*3/uL (ref 1.7–7.7)
Neutrophils Relative %: 60 %
Platelets: 265 10*3/uL (ref 150–400)
RBC: 3.97 MIL/uL (ref 3.87–5.11)
RDW: 12.8 % (ref 11.5–15.5)
WBC: 8.3 10*3/uL (ref 4.0–10.5)
nRBC: 0 % (ref 0.0–0.2)

## 2021-03-18 LAB — BASIC METABOLIC PANEL
Anion gap: 6 (ref 5–15)
BUN: 10 mg/dL (ref 6–20)
CO2: 25 mmol/L (ref 22–32)
Calcium: 8.8 mg/dL — ABNORMAL LOW (ref 8.9–10.3)
Chloride: 106 mmol/L (ref 98–111)
Creatinine, Ser: 0.65 mg/dL (ref 0.44–1.00)
GFR, Estimated: >60 mL/min (ref >60)
Glucose, Bld: 97 mg/dL (ref 70–99)
Potassium: 3.3 mmol/L — ABNORMAL LOW (ref 3.5–5.1)
Sodium: 137 mmol/L (ref 135–145)

## 2021-03-18 LAB — VITAMIN B12: Vitamin B-12: 380 pg/mL (ref 180–914)

## 2021-03-18 LAB — PHOSPHORUS: Phosphorus: 3.6 mg/dL (ref 2.5–4.6)

## 2021-03-18 LAB — I-STAT BETA HCG BLOOD, ED (MC, WL, AP ONLY): I-stat hCG, quantitative: <5 m[IU]/mL (ref <5)

## 2021-03-18 LAB — MAGNESIUM: Magnesium: 2 mg/dL (ref 1.7–2.4)

## 2021-03-18 MED ORDER — KETOROLAC TROMETHAMINE 30 MG/ML IJ SOLN
30.0000 mg | Freq: Once | INTRAMUSCULAR | Status: AC
  Administered 2021-03-18: 30 mg via INTRAVENOUS
  Filled 2021-03-18: qty 1

## 2021-03-18 MED ORDER — POTASSIUM CHLORIDE CRYS ER 20 MEQ PO TBCR
40.0000 meq | EXTENDED_RELEASE_TABLET | Freq: Once | ORAL | Status: AC
  Administered 2021-03-18: 40 meq via ORAL
  Filled 2021-03-18: qty 2

## 2021-03-18 MED ORDER — METHOCARBAMOL 500 MG PO TABS
500.0000 mg | ORAL_TABLET | Freq: Once | ORAL | Status: AC
  Administered 2021-03-18: 500 mg via ORAL
  Filled 2021-03-18: qty 1

## 2021-03-18 MED ORDER — POTASSIUM CHLORIDE ER 10 MEQ PO TBCR
40.0000 meq | EXTENDED_RELEASE_TABLET | Freq: Every day | ORAL | 0 refills | Status: AC
Start: 2021-03-18 — End: 2021-04-01

## 2021-03-18 NOTE — Discharge Instructions (Signed)
Your potassium was found to be low. This could have caused all of your symptoms today. Please take potassium supplement daily until you are seen by your PCP. You should be reevaluated in 1-2 weeks and have your potassium level rechecked. Your PCP can determine whether to keep you on this medication or not.  ?
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
## 2021-03-18 NOTE — ED Provider Notes (Signed)
?[facility]-EMERGENCY DEPT ?Provider Note ? ? ?CSN: [id number] ?Arrival date & time: 03/18/21  1018 ? ?  ? ?History ?PMH: hypothyroidism, Vitamin B12 deficiency ?Chief Complaint  ?Patient presents with  ? Leg Pain  ? ? ?Sue Collins is a 26 y.o. female. ?Patient presents the emergency department with right arm weakness and right leg pain.  She states that she was in her normal state of health until 0300 when she had sudden onset of nausea and lightheadedness while at work. She then felt her right arm become weak. She was able to lift arm, but states that it felt very heavy. This lasted for about three hours and resolved. She also states that at about 0700, she developed sudden onset right calf pain. She said that it felt like a bad cramp. The pain has now moved up to her left thigh. Most of the pain is in the thigh at this point. It is worse with movement. No associated leg swelling or redness. No associated trauma. Patient denies headache, numbness, cp, shortness of breath, vomiting, abdominal pain.  ? ? ?Leg Pain ? ?  ? ?Home Medications ?Prior to Admission medications   ?Medication Sig Start Date End Date Taking? Authorizing Provider  ?amphetamine-dextroamphetamine (ADDERALL) 15 MG tablet Take 15 mg by mouth 2 (two) times daily. 02/10/21  Yes [provider]  ?Cholecalciferol (VITAMIN D3 PO) Take 1 capsule by mouth daily.   Yes [provider]  ?FLUoxetine (PROZAC) 40 MG capsule Take 40 mg by mouth daily. 02/10/21  Yes [provider]  ?lamoTRIgine (LAMICTAL) 100 MG tablet Take 100 mg by mouth daily. 02/10/21  Yes [provider]  ?levothyroxine (SYNTHROID) 50 MCG tablet Take 50 mcg by mouth daily. 02/10/21  Yes [provider]  ?omeprazole (PRILOSEC) 40 MG capsule Take 40 mg by mouth daily as needed (acid reflux). 12/29/20  Yes [provider]  ?ondansetron (ZOFRAN-ODT) 4 MG disintegrating tablet Take 4 mg by mouth every 8 (eight) hours as needed.  02/04/21  Yes [provider]  ?potassium chloride (KLOR-CON) 10 MEQ tablet Take 4 tablets (40 mEq total) by mouth daily for 14 days. 03/18/21 04/01/21 Yes Delawrence Fridman, Finis BudGrace C, PA-C  ?risperiDONE (RISPERDAL) 1 MG tablet Take 1 mg by mouth at bedtime. 02/10/21  Yes [provider]  ?   ? ?Allergies    ?Patient has no known allergies.   ? ?Review of Systems   ?Review of Systems  ?Respiratory:  Negative for shortness of breath.   ?Cardiovascular:  Negative for chest pain and leg swelling.  ?Gastrointestinal:  Positive for nausea. Negative for abdominal pain and vomiting.  ?Musculoskeletal:  Positive for myalgias.  ?Neurological:  Positive for weakness and light-headedness. Negative for seizures, facial asymmetry, speech difficulty, numbness and headaches.  ?All other systems reviewed and are negative. ? ?Physical Exam ?Updated Vital Signs ?BP 127/74   Pulse 76   Temp 98.4 ?F (36.9 ?C) (Oral)   Resp 18   Ht 5\' 5"  (1.651 m)   Wt 103.4 kg   LMP 02/03/2021 (Approximate)   SpO2 100%   BMI 37.94 kg/m?  ?Physical Exam ?Vitals and nursing note reviewed.  ?Constitutional:   ?   General: She is not in acute distress. ?   Appearance: Normal appearance. She is not ill-appearing, toxic-appearing or diaphoretic.  ?HENT:  ?   Head: Normocephalic and atraumatic.  ?   Nose: No nasal deformity.  ?   Mouth/Throat:  ?   Lips: Pink. No lesions.  ?  Mouth: Mucous membranes are moist. No injury, lacerations, oral lesions or angioedema.  ?   Pharynx: Oropharynx is clear. Uvula midline. No pharyngeal swelling, oropharyngeal exudate, posterior oropharyngeal erythema or uvula swelling.  ?Eyes:  ?   General: Gaze aligned appropriately. No scleral icterus.    ?   Right eye: No discharge.     ?   Left eye: No discharge.  ?   Conjunctiva/sclera: Conjunctivae normal.  ?   Right eye: Right conjunctiva is not injected. No exudate or hemorrhage. ?   Left eye: Left conjunctiva is not injected. No exudate or hemorrhage. ?   Pupils:  Pupils are equal, round, and reactive to light.  ?Cardiovascular:  ?   Rate and Rhythm: Normal rate and regular rhythm.  ?   Pulses: Normal pulses.     ?     Radial pulses are 2+ on the right side and 2+ on the left side.  ?     Dorsalis pedis pulses are 2+ on the right side and 2+ on the left side.  ?   Heart sounds: Normal heart sounds, S1 normal and S2 normal. Heart sounds not distant. No murmur heard. ?  No friction rub. No gallop. No S3 or S4 sounds.  ?Pulmonary:  ?   Effort: Pulmonary effort is normal. No accessory muscle usage or respiratory distress.  ?   Breath sounds: Normal breath sounds. No stridor. No wheezing, rhonchi or rales.  ?Chest:  ?   Chest wall: No tenderness.  ?Abdominal:  ?   General: Abdomen is flat. Bowel sounds are normal. There is no distension.  ?   Palpations: Abdomen is soft. There is no mass or pulsatile mass.  ?   Tenderness: There is no abdominal tenderness. There is no guarding or rebound.  ?Musculoskeletal:  ?   Right lower leg: No edema.  ?   Left lower leg: No edema.  ?   Comments: LLE:  ?- No focal tenderness from bottom of calf all the way up to mid thigh. At times though, patient will subjectively experience pains in the thigh. No bruising or injury noted. No obvious leg swelling. No swelling of knee or hip joint. Able to range hip and knee without difficulty. No crepitus felt on palpation. No redness or drainage from leg. No evidence of wound.   ?Skin: ?   General: Skin is warm and dry.  ?   Coloration: Skin is not jaundiced or pale.  ?   Findings: No bruising, erythema, lesion or rash.  ?Neurological:  ?   General: No focal deficit present.  ?   Mental Status: She is alert and oriented to person, place, and time.  ?   GCS: GCS eye subscore is 4. GCS verbal subscore is 5. GCS motor subscore is 6.  ?   Comments: Alert and Oriented x 3 ?Speech clear with no aphasia ?Cranial Nerve testing ?- PERRLA. EOM intact. No Nystagmus ?- Facial Sensation grossly intact ?- No facial  asymmetry ?- Uvula and Tongue Midline ?- Accessory Muscles intact ?Motor: ?- 5/5 motor strength in all four extremities.  ?Sensation: ?- Grossly intact in all four extremities.  ?Coordination:  ?- Finger to nose and heel to shin intact bilaterally ?- Gait without abnormality. ?  ?Psychiatric:     ?   Mood and Affect: Mood normal.     ?   Behavior: Behavior normal. Behavior is cooperative.  ? ? ?ED Results / Procedures / Treatments   ?Labs ?(all labs  ordered are listed, but only abnormal results are displayed) ?Labs Reviewed  ?BASIC METABOLIC PANEL - Abnormal; Notable for the following components:  ?    Result Value  ? Potassium 3.3 (*)   ? Calcium 8.8 (*)   ? All other components within normal limits  ?CBC WITH DIFFERENTIAL/PLATELET  ?MAGNESIUM  ?PHOSPHORUS  ?VITAMIN B12  ?I-STAT BETA HCG BLOOD, ED ([facility], [facility], [facility] ONLY)  ? ? ?EKG ?None ? ?Radiology ?CT Head Wo Contrast ? ?Result Date: 03/18/2021 ?CLINICAL DATA:  "Patient reports that she was at work at 0300 and began having right leg pain and weakness. Patient reports that it feels like centralized cramping" EXAM: CT HEAD WITHOUT CONTRAST TECHNIQUE: Contiguous axial images were obtained from the base of the skull through the vertex without intravenous contrast. RADIATION DOSE REDUCTION: This exam was performed according to the departmental dose-optimization program which includes automated exposure control, adjustment of the mA and/or kV according to patient size and/or use of iterative reconstruction technique. COMPARISON:  12/26/2019 FINDINGS: Brain: No evidence of acute infarction, hemorrhage, hydrocephalus, extra-axial collection or mass lesion/mass effect. Vascular: No hyperdense vessel or unexpected calcification. Skull: Normal. Negative for fracture or focal lesion. Sinuses/Orbits: Globes and orbits are within normal limits. Visualized sinuses are clear. Other: None. IMPRESSION: Normal unenhanced CT scan of the brain.  Scan Electronically Signed   By: Amie Portland M.D.   On: 03/18/2021 12:06  ? ?VAS [place] LOWER EXTREMITY VENOUS (DVT) (7a-7p) ? ?Result Date: 03/18/2021 ? Lower Venous DVT Study Patient Name:  Sue Collins  Date of Exam:   03/18/2021 Medical Rec #: [id number]     Acces

## 2021-03-18 NOTE — Progress Notes (Signed)
RLE venous duplex has been completed.  Preliminary results messaged to Paulita Cradle, PA-C via secure chat. ? ? ?Results can be found under chart review under CV PROC. ?03/18/2021 1:20 PM ?Nelwyn Hebdon RVT, RDMS ? ?

## 2021-03-24 DIAGNOSIS — F331 Major depressive disorder, recurrent, moderate: Secondary | ICD-10-CM | POA: Diagnosis not present

## 2021-03-29 DIAGNOSIS — F331 Major depressive disorder, recurrent, moderate: Secondary | ICD-10-CM | POA: Diagnosis not present

## 2021-04-05 DIAGNOSIS — F331 Major depressive disorder, recurrent, moderate: Secondary | ICD-10-CM | POA: Diagnosis not present

## 2021-04-07 DIAGNOSIS — E039 Hypothyroidism, unspecified: Secondary | ICD-10-CM | POA: Diagnosis not present

## 2021-04-07 DIAGNOSIS — Z111 Encounter for screening for respiratory tuberculosis: Secondary | ICD-10-CM | POA: Diagnosis not present

## 2021-04-07 DIAGNOSIS — E876 Hypokalemia: Secondary | ICD-10-CM | POA: Diagnosis not present

## 2021-04-11 DIAGNOSIS — R109 Unspecified abdominal pain: Secondary | ICD-10-CM | POA: Diagnosis not present

## 2021-04-20 DIAGNOSIS — F331 Major depressive disorder, recurrent, moderate: Secondary | ICD-10-CM | POA: Diagnosis not present

## 2021-05-11 ENCOUNTER — Other Ambulatory Visit: Payer: Self-pay | Admitting: Physician Assistant

## 2021-05-11 DIAGNOSIS — F331 Major depressive disorder, recurrent, moderate: Secondary | ICD-10-CM | POA: Diagnosis not present

## 2021-05-11 DIAGNOSIS — K5900 Constipation, unspecified: Secondary | ICD-10-CM | POA: Diagnosis not present

## 2021-05-11 DIAGNOSIS — R110 Nausea: Secondary | ICD-10-CM | POA: Diagnosis not present

## 2021-05-11 DIAGNOSIS — R1013 Epigastric pain: Secondary | ICD-10-CM

## 2021-05-12 ENCOUNTER — Ambulatory Visit
Admission: RE | Admit: 2021-05-12 | Discharge: 2021-05-12 | Disposition: A | Discharge status: Home / Self Care | Payer: BC Managed Care – PPO | Source: Ambulatory Visit | Attending: Physician Assistant | Admitting: Physician Assistant

## 2021-05-12 ENCOUNTER — Other Ambulatory Visit: Payer: Self-pay | Admitting: Physician Assistant

## 2021-05-12 DIAGNOSIS — K5900 Constipation, unspecified: Secondary | ICD-10-CM | POA: Diagnosis not present

## 2021-05-12 DIAGNOSIS — Z8719 Personal history of other diseases of the digestive system: Secondary | ICD-10-CM | POA: Diagnosis not present

## 2021-05-12 DIAGNOSIS — R110 Nausea: Secondary | ICD-10-CM | POA: Diagnosis not present

## 2021-05-12 DIAGNOSIS — R1013 Epigastric pain: Secondary | ICD-10-CM | POA: Diagnosis not present

## 2021-05-17 DIAGNOSIS — F331 Major depressive disorder, recurrent, moderate: Secondary | ICD-10-CM | POA: Diagnosis not present

## 2021-05-20 DIAGNOSIS — E282 Polycystic ovarian syndrome: Secondary | ICD-10-CM | POA: Diagnosis not present

## 2021-05-20 DIAGNOSIS — R7301 Impaired fasting glucose: Secondary | ICD-10-CM | POA: Diagnosis not present

## 2021-05-20 DIAGNOSIS — E221 Hyperprolactinemia: Secondary | ICD-10-CM | POA: Diagnosis not present

## 2021-05-20 DIAGNOSIS — E02 Subclinical iodine-deficiency hypothyroidism: Secondary | ICD-10-CM | POA: Diagnosis not present

## 2021-05-23 DIAGNOSIS — F331 Major depressive disorder, recurrent, moderate: Secondary | ICD-10-CM | POA: Diagnosis not present

## 2021-05-27 DIAGNOSIS — R7301 Impaired fasting glucose: Secondary | ICD-10-CM | POA: Diagnosis not present

## 2021-05-27 DIAGNOSIS — E221 Hyperprolactinemia: Secondary | ICD-10-CM | POA: Diagnosis not present

## 2021-05-27 DIAGNOSIS — E282 Polycystic ovarian syndrome: Secondary | ICD-10-CM | POA: Diagnosis not present

## 2021-05-27 DIAGNOSIS — E02 Subclinical iodine-deficiency hypothyroidism: Secondary | ICD-10-CM | POA: Diagnosis not present

## 2021-05-31 DIAGNOSIS — J019 Acute sinusitis, unspecified: Secondary | ICD-10-CM | POA: Diagnosis not present

## 2021-05-31 DIAGNOSIS — J40 Bronchitis, not specified as acute or chronic: Secondary | ICD-10-CM | POA: Diagnosis not present

## 2021-06-02 DIAGNOSIS — Z5309 Procedure and treatment not carried out because of other contraindication: Secondary | ICD-10-CM | POA: Diagnosis not present

## 2021-06-02 DIAGNOSIS — R1013 Epigastric pain: Secondary | ICD-10-CM | POA: Diagnosis not present

## 2021-06-02 DIAGNOSIS — R110 Nausea: Secondary | ICD-10-CM | POA: Diagnosis not present

## 2021-06-06 DIAGNOSIS — F331 Major depressive disorder, recurrent, moderate: Secondary | ICD-10-CM | POA: Diagnosis not present

## 2021-06-15 DIAGNOSIS — F900 Attention-deficit hyperactivity disorder, predominantly inattentive type: Secondary | ICD-10-CM | POA: Diagnosis not present

## 2021-06-15 DIAGNOSIS — F331 Major depressive disorder, recurrent, moderate: Secondary | ICD-10-CM | POA: Diagnosis not present

## 2021-06-15 DIAGNOSIS — F3181 Bipolar II disorder: Secondary | ICD-10-CM | POA: Diagnosis not present

## 2021-06-22 DIAGNOSIS — F331 Major depressive disorder, recurrent, moderate: Secondary | ICD-10-CM | POA: Diagnosis not present

## 2021-06-27 DIAGNOSIS — F331 Major depressive disorder, recurrent, moderate: Secondary | ICD-10-CM | POA: Diagnosis not present

## 2021-06-28 ENCOUNTER — Other Ambulatory Visit: Payer: Self-pay | Admitting: Physician Assistant

## 2021-06-28 DIAGNOSIS — R1013 Epigastric pain: Secondary | ICD-10-CM | POA: Diagnosis not present

## 2021-06-28 DIAGNOSIS — K5900 Constipation, unspecified: Secondary | ICD-10-CM | POA: Diagnosis not present

## 2021-06-28 DIAGNOSIS — K219 Gastro-esophageal reflux disease without esophagitis: Secondary | ICD-10-CM | POA: Diagnosis not present

## 2021-06-28 DIAGNOSIS — R110 Nausea: Secondary | ICD-10-CM | POA: Diagnosis not present

## 2021-06-29 DIAGNOSIS — F3181 Bipolar II disorder: Secondary | ICD-10-CM | POA: Diagnosis not present

## 2021-06-29 DIAGNOSIS — F900 Attention-deficit hyperactivity disorder, predominantly inattentive type: Secondary | ICD-10-CM | POA: Diagnosis not present

## 2021-06-30 ENCOUNTER — Ambulatory Visit
Admission: RE | Admit: 2021-06-30 | Discharge: 2021-06-30 | Disposition: A | Discharge status: Home / Self Care | Payer: BC Managed Care – PPO | Source: Ambulatory Visit | Attending: Physician Assistant | Admitting: Physician Assistant

## 2021-06-30 DIAGNOSIS — K219 Gastro-esophageal reflux disease without esophagitis: Secondary | ICD-10-CM | POA: Diagnosis not present

## 2021-06-30 DIAGNOSIS — R1013 Epigastric pain: Secondary | ICD-10-CM | POA: Diagnosis not present

## 2021-06-30 DIAGNOSIS — R110 Nausea: Secondary | ICD-10-CM

## 2021-07-07 DIAGNOSIS — F332 Major depressive disorder, recurrent severe without psychotic features: Secondary | ICD-10-CM | POA: Diagnosis not present

## 2021-07-13 DIAGNOSIS — F331 Major depressive disorder, recurrent, moderate: Secondary | ICD-10-CM | POA: Diagnosis not present

## 2021-07-14 DIAGNOSIS — F332 Major depressive disorder, recurrent severe without psychotic features: Secondary | ICD-10-CM | POA: Diagnosis not present

## 2021-07-21 DIAGNOSIS — F332 Major depressive disorder, recurrent severe without psychotic features: Secondary | ICD-10-CM | POA: Diagnosis not present

## 2021-07-27 DIAGNOSIS — F331 Major depressive disorder, recurrent, moderate: Secondary | ICD-10-CM | POA: Diagnosis not present

## 2021-07-28 ENCOUNTER — Other Ambulatory Visit: Payer: Self-pay | Admitting: Gastroenterology

## 2021-07-28 DIAGNOSIS — F332 Major depressive disorder, recurrent severe without psychotic features: Secondary | ICD-10-CM | POA: Diagnosis not present

## 2021-08-09 DIAGNOSIS — F332 Major depressive disorder, recurrent severe without psychotic features: Secondary | ICD-10-CM | POA: Diagnosis not present

## 2021-08-10 DIAGNOSIS — F900 Attention-deficit hyperactivity disorder, predominantly inattentive type: Secondary | ICD-10-CM | POA: Diagnosis not present

## 2021-08-10 DIAGNOSIS — F3181 Bipolar II disorder: Secondary | ICD-10-CM | POA: Diagnosis not present

## 2021-08-18 DIAGNOSIS — F332 Major depressive disorder, recurrent severe without psychotic features: Secondary | ICD-10-CM | POA: Diagnosis not present

## 2021-09-06 DIAGNOSIS — F332 Major depressive disorder, recurrent severe without psychotic features: Secondary | ICD-10-CM | POA: Diagnosis not present

## 2021-09-12 DIAGNOSIS — F332 Major depressive disorder, recurrent severe without psychotic features: Secondary | ICD-10-CM | POA: Diagnosis not present

## 2021-10-12 DIAGNOSIS — E559 Vitamin D deficiency, unspecified: Secondary | ICD-10-CM | POA: Diagnosis not present

## 2021-10-12 DIAGNOSIS — K5900 Constipation, unspecified: Secondary | ICD-10-CM | POA: Diagnosis not present

## 2021-10-12 DIAGNOSIS — Z23 Encounter for immunization: Secondary | ICD-10-CM | POA: Diagnosis not present

## 2021-10-12 DIAGNOSIS — K219 Gastro-esophageal reflux disease without esophagitis: Secondary | ICD-10-CM | POA: Diagnosis not present

## 2021-10-12 DIAGNOSIS — Z79899 Other long term (current) drug therapy: Secondary | ICD-10-CM | POA: Diagnosis not present

## 2021-10-12 DIAGNOSIS — E538 Deficiency of other specified B group vitamins: Secondary | ICD-10-CM | POA: Diagnosis not present

## 2021-10-12 DIAGNOSIS — F332 Major depressive disorder, recurrent severe without psychotic features: Secondary | ICD-10-CM | POA: Diagnosis not present

## 2021-10-12 DIAGNOSIS — F3181 Bipolar II disorder: Secondary | ICD-10-CM | POA: Diagnosis not present

## 2021-10-12 DIAGNOSIS — E669 Obesity, unspecified: Secondary | ICD-10-CM | POA: Diagnosis not present

## 2021-10-18 ENCOUNTER — Encounter (HOSPITAL_COMMUNITY): Payer: Self-pay | Admitting: Gastroenterology

## 2021-10-25 ENCOUNTER — Encounter (HOSPITAL_COMMUNITY): Payer: Self-pay | Admitting: Gastroenterology

## 2021-10-25 ENCOUNTER — Ambulatory Visit (HOSPITAL_COMMUNITY)
Admission: RE | Admit: 2021-10-25 | Discharge: 2021-10-25 | Disposition: A | Discharge status: Home / Self Care | Payer: BC Managed Care – PPO | Source: Ambulatory Visit | Attending: Gastroenterology | Admitting: Gastroenterology

## 2021-10-25 ENCOUNTER — Other Ambulatory Visit: Payer: Self-pay

## 2021-10-25 ENCOUNTER — Encounter (HOSPITAL_COMMUNITY)
Admission: RE | Disposition: A | Discharge status: Home / Self Care | Payer: Self-pay | Source: Ambulatory Visit | Attending: Gastroenterology

## 2021-10-25 ENCOUNTER — Ambulatory Visit (HOSPITAL_COMMUNITY): Payer: BC Managed Care – PPO | Admitting: Certified Registered"

## 2021-10-25 DIAGNOSIS — K219 Gastro-esophageal reflux disease without esophagitis: Secondary | ICD-10-CM | POA: Diagnosis present

## 2021-10-25 DIAGNOSIS — R933 Abnormal findings on diagnostic imaging of other parts of digestive tract: Secondary | ICD-10-CM | POA: Insufficient documentation

## 2021-10-25 DIAGNOSIS — R110 Nausea: Secondary | ICD-10-CM | POA: Insufficient documentation

## 2021-10-25 DIAGNOSIS — K2950 Unspecified chronic gastritis without bleeding: Secondary | ICD-10-CM | POA: Diagnosis not present

## 2021-10-25 DIAGNOSIS — K5900 Constipation, unspecified: Secondary | ICD-10-CM | POA: Insufficient documentation

## 2021-10-25 DIAGNOSIS — E039 Hypothyroidism, unspecified: Secondary | ICD-10-CM | POA: Diagnosis not present

## 2021-10-25 DIAGNOSIS — R1013 Epigastric pain: Secondary | ICD-10-CM | POA: Diagnosis not present

## 2021-10-25 DIAGNOSIS — Z79899 Other long term (current) drug therapy: Secondary | ICD-10-CM | POA: Insufficient documentation

## 2021-10-25 DIAGNOSIS — K2900 Acute gastritis without bleeding: Secondary | ICD-10-CM | POA: Insufficient documentation

## 2021-10-25 DIAGNOSIS — Z6841 Body Mass Index (BMI) 40.0 and over, adult: Secondary | ICD-10-CM | POA: Diagnosis not present

## 2021-10-25 HISTORY — PX: BIOPSY: SHX5522

## 2021-10-25 HISTORY — PX: ESOPHAGOGASTRODUODENOSCOPY (EGD) WITH PROPOFOL: SHX5813

## 2021-10-25 HISTORY — DX: Family history of other specified conditions: Z84.89

## 2021-10-25 LAB — PREGNANCY, URINE: Preg Test, Ur: NEGATIVE

## 2021-10-25 SURGERY — ESOPHAGOGASTRODUODENOSCOPY (EGD) WITH PROPOFOL
Anesthesia: Monitor Anesthesia Care

## 2021-10-25 MED ORDER — PROPOFOL 500 MG/50ML IV EMUL
INTRAVENOUS | Status: DC | PRN
  Administered 2021-10-25: 125 ug/kg/min via INTRAVENOUS

## 2021-10-25 MED ORDER — SODIUM CHLORIDE 0.9 % IV SOLN: INTRAVENOUS | Status: DC

## 2021-10-25 MED ORDER — PROPOFOL 10 MG/ML IV BOLUS
INTRAVENOUS | Status: DC | PRN
  Administered 2021-10-25: 50 mg via INTRAVENOUS
  Administered 2021-10-25: 20 mg via INTRAVENOUS

## 2021-10-25 MED ORDER — LACTATED RINGERS IV SOLN: INTRAVENOUS | Status: DC

## 2021-10-25 MED ORDER — KETAMINE HCL 10 MG/ML IJ SOLN
INTRAMUSCULAR | Status: DC | PRN
  Administered 2021-10-25: 10 mg via INTRAVENOUS

## 2021-10-25 MED ORDER — KETAMINE HCL 10 MG/ML IJ SOLN
INTRAMUSCULAR | Status: AC
  Filled 2021-10-25: qty 1

## 2021-10-25 MED ORDER — GLYCOPYRROLATE 0.2 MG/ML IJ SOLN
INTRAMUSCULAR | Status: DC | PRN
  Administered 2021-10-25: .2 mg via INTRAVENOUS

## 2021-10-25 MED ORDER — LIDOCAINE 2% (20 MG/ML) 5 ML SYRINGE
INTRAMUSCULAR | Status: DC | PRN
  Administered 2021-10-25: 60 mg via INTRAVENOUS

## 2021-10-25 MED ORDER — PHENYLEPHRINE 80 MCG/ML (10ML) SYRINGE FOR IV PUSH (FOR BLOOD PRESSURE SUPPORT)
PREFILLED_SYRINGE | INTRAVENOUS | Status: DC | PRN
  Administered 2021-10-25: 80 ug via INTRAVENOUS

## 2021-10-25 SURGICAL SUPPLY — 15 items

## 2021-10-25 NOTE — Op Note (Signed)
[facility] Patient Name: Sue Collins Procedure Date: 10/25/2021 MRN: [id number] Attending MD: Lear Ng , MD Date of Birth: 1995-06-08 CSN: [id number] Age: 26 Admit Type: Outpatient Procedure:                Upper GI endoscopy Indications:              Epigastric abdominal pain, Nausea Providers:                Lear Ng, MD, Mikey College, RN,                            Fransico Setters Mbumina, Technician Referring MD:             Harlan Stains Medicines:                Propofol per Anesthesia, Monitored Anesthesia Care Complications:            No immediate complications. Estimated Blood Loss:     Estimated blood loss was minimal. Procedure:                Pre-Anesthesia Assessment:                           - Prior to the procedure, a History and Physical                            was performed, and patient medications and                            allergies were reviewed. The patient's tolerance of                            previous anesthesia was also reviewed. The risks                            and benefits of the procedure and the sedation                            options and risks were discussed with the patient.                            All questions were answered, and informed consent                            was obtained. Prior Anticoagulants: The patient has                            taken no previous anticoagulant or antiplatelet                            agents. ASA Grade Assessment: III - A patient with                            severe systemic disease. After reviewing the risks  and benefits, the patient was deemed in                            satisfactory condition to undergo the procedure.                           After obtaining informed consent, the endoscope was                            passed under direct vision. Throughout the                            procedure, the patient's blood  pressure, pulse, and                            oxygen saturations were monitored continuously. The                            [id number]-[id number] ([id number]) Olympus endoscope was introduced                            through the mouth, and advanced to the second part                            of duodenum. The upper GI endoscopy was                            accomplished without difficulty. The patient                            tolerated the procedure well. Technical                            difficulties prevented photodocumentation of the                            incisura angularis, which was normal in appearance. Scope In: Scope Out: Findings:      The examined esophagus was normal.      The Z-line was regular and was found 40 cm from the incisors.      Segmental mild inflammation characterized by congestion (edema) and       erythema was found in the gastric antrum. Biopsies were taken with a       cold forceps for histology. Estimated blood loss was minimal.      The exam of the stomach was otherwise normal.      The examined duodenum was normal. Impression:               - Normal esophagus.                           - Z-line regular, 40 cm from the incisors.                           - Acute gastritis. Biopsied.                           -  Normal examined duodenum. Moderate Sedation:      N/A - MAC procedure Recommendation:           - Patient has a contact number available for                            emergencies. The signs and symptoms of potential                            delayed complications were discussed with the                            patient. Return to normal activities tomorrow.                            Written discharge instructions were provided to the                            patient.                           - Follow an antireflux regimen.                           - Await pathology results. Procedure Code(s):        --- Professional ---                            [phone], Esophagogastroduodenoscopy, flexible,                            transoral; with biopsy, single or multiple Diagnosis Code(s):        --- Professional ---                           R10.13, Epigastric pain                           K29.00, Acute gastritis without bleeding                           R11.0, Nausea CPT copyright 2019 [employer] Association. All rights reserved. The codes documented in this report are preliminary and upon coder review may  be revised to meet current compliance requirements. Lear Ng, MD 10/25/2021 12:43:29 PM This report has been signed electronically. Number of Addenda: 0

## 2021-10-25 NOTE — Progress Notes (Signed)
[facility] Gastroenterology Progress Note  Sue Collins 26 y.o. October 18, 1995   Subjective: 26 yo with recurrent epigastric pain, nausea, heartburn, and constipation. All of her symptoms that were occurring in the spring and summer have resolved and only has rare heartburn and nausea now. Attempted EGD in the summer was not successful due to retching and gagging during sedation and the procedure was not attempted. Takes Omeprazole 40 mg/day. UGIS showed mildly irregular appearance of the gastric folds. No history of rectal bleeding or black stools.  Objective: Vital signs: Vitals:   10/25/21 1103  BP: 129/71  Pulse: 83  Resp: 13  Temp: 98.1 F (36.7 C)  SpO2: 98%    Physical Exam: Gen: alert, no acute distress, obese, pleasant HEENT: anicteric sclera CV: RRR Chest: CTA B Abd: soft, nontender, nondistended, +BS Ext: no edema Psych: normal mood, affect  Lab Results: No results for input(s): "NA", "K", "CL", "CO2", "GLUCOSE", "BUN", "CREATININE", "CALCIUM", "MG", "PHOS" in the last 72 hours. No results for input(s): "AST", "ALT", "ALKPHOS", "BILITOT", "PROT", "ALBUMIN" in the last 72 hours. No results for input(s): "WBC", "NEUTROABS", "HGB", "HCT", "MCV", "PLT" in the last 72 hours.    Assessment/Plan: 26 yo with GERD and history of nausea with an abnormal UGIS that showed mildly irregular appearance of the gastric folds likely due to underdistention but needs an EGD to further evaluate. Suspect GERD and/or dyspepsia as source of her epigastric pain and nausea.   Lear Ng 10/25/2021, 12:13 PM  Questions please call [phone] Patient ID: Sue Collins, female   DOB: 03-Dec-1995, 26 y.o.   MRN: [id number]

## 2021-10-25 NOTE — Anesthesia Preprocedure Evaluation (Signed)
Anesthesia Evaluation  Patient identified by MRN, date of birth, ID band Patient awake    Reviewed: Allergy & Precautions, NPO status , Patient's Chart, lab work & pertinent test results  Airway Mallampati: III  TM Distance: >3 FB Neck ROM: Full    Dental   Pulmonary neg pulmonary ROS,    breath sounds clear to auscultation       Cardiovascular negative cardio ROS   Rhythm:Regular Rate:Normal     Neuro/Psych negative neurological ROS     GI/Hepatic Neg liver ROS, GERD  ,  Endo/Other  Hypothyroidism Morbid obesity  Renal/GU negative Renal ROS     Musculoskeletal   Abdominal   Peds  Hematology negative hematology ROS (+)   Anesthesia Other Findings   Reproductive/Obstetrics                             Anesthesia Physical Anesthesia Plan  ASA: 3  Anesthesia Plan: MAC   Post-op Pain Management:    Induction:   PONV Risk Score and Plan: 2 and Propofol infusion and Ondansetron  Airway Management Planned: Natural Airway and Nasal Cannula  Additional Equipment:   Intra-op Plan:   Post-operative Plan:   Informed Consent: I have reviewed the patients History and Physical, chart, labs and discussed the procedure including the risks, benefits and alternatives for the proposed anesthesia with the patient or authorized representative who has indicated his/her understanding and acceptance.       Plan Discussed with:   Anesthesia Plan Comments:         Anesthesia Quick Evaluation

## 2021-10-25 NOTE — Discharge Instructions (Signed)
YOU HAD AN ENDOSCOPIC PROCEDURE TODAY: Refer to the procedure report and other information in the discharge instructions given to you for any specific questions about what was found during the examination. If this information does not answer your questions, please call [facility] office at [phone] to clarify.   YOU SHOULD EXPECT: Some feelings of bloating in the abdomen. Passage of more gas than usual. Walking can help get rid of the air that was put into your GI tract during the procedure and reduce the bloating. If you had a lower endoscopy (such as a colonoscopy or flexible sigmoidoscopy) you may notice spotting of blood in your stool or on the toilet paper. Some abdominal soreness may be present for a day or two, also.  DIET: Your first meal following the procedure should be a light meal and then it is ok to progress to your normal diet. A half-sandwich or bowl of soup is an example of a good first meal. Heavy or fried foods are harder to digest and may make you feel nauseous or bloated. Drink plenty of fluids but you should avoid alcoholic beverages for 24 hours. If you had a esophageal dilation, please see attached instructions for diet.    ACTIVITY: Your care partner should take you home directly after the procedure. You should plan to take it easy, moving slowly for the rest of the day. You can resume normal activity the day after the procedure however YOU SHOULD NOT DRIVE, use power tools, machinery or perform tasks that involve climbing or major physical exertion for 24 hours (because of the sedation medicines used during the test).   SYMPTOMS TO REPORT IMMEDIATELY: A gastroenterologist can be reached at any hour. Please call [phone]  for any of the following symptoms:   . Following upper endoscopy (EGD, EUS, ERCP, esophageal dilation) Vomiting of blood or coffee ground material  New, significant abdominal pain  New, significant chest pain or pain under the shoulder blades  Painful  or persistently difficult swallowing  New shortness of breath  Black, tarry-looking or red, bloody stools  FOLLOW UP:  If any biopsies were taken you will be contacted by phone or by letter within the next 1-3 weeks. Call [phone]  if you have not heard about the biopsies in 3 weeks.  Please also call with any specific questions about appointments or follow up tests. YOU HAD AN ENDOSCOPIC PROCEDURE TODAY: Refer to the procedure report and other information in the discharge instructions given to you for any specific questions about what was found during the examination. If this information does not answer your questions, please call [facility] office at [phone] to clarify.   YOU SHOULD EXPECT: Some feelings of bloating in the abdomen. Passage of more gas than usual. Walking can help get rid of the air that was put into your GI tract during the procedure and reduce the bloating. If you had a lower endoscopy (such as a colonoscopy or flexible sigmoidoscopy) you may notice spotting of blood in your stool or on the toilet paper. Some abdominal soreness may be present for a day or two, also.  DIET: Your first meal following the procedure should be a light meal and then it is ok to progress to your normal diet. A half-sandwich or bowl of soup is an example of a good first meal. Heavy or fried foods are harder to digest and may make you feel nauseous or bloated. Drink plenty of fluids but you should avoid alcoholic beverages for 24   hours. If you had a esophageal dilation, please see attached instructions for diet.    ACTIVITY: Your care partner should take you home directly after the procedure. You should plan to take it easy, moving slowly for the rest of the day. You can resume normal activity the day after the procedure however YOU SHOULD NOT DRIVE, use power tools, machinery or perform tasks that involve climbing or major physical exertion for 24 hours (because of the sedation medicines used during  the test).   SYMPTOMS TO REPORT IMMEDIATELY: A gastroenterologist can be reached at any hour. Please call [phone]  for any of the following symptoms:  . Following upper endoscopy (EGD, EUS, ERCP, esophageal dilation) Vomiting of blood or coffee ground material  New, significant abdominal pain  New, significant chest pain or pain under the shoulder blades  Painful or persistently difficult swallowing  New shortness of breath  Black, tarry-looking or red, bloody stools  FOLLOW UP:  If any biopsies were taken you will be contacted by phone or by letter within the next 1-3 weeks. Call [phone]  if you have not heard about the biopsies in 3 weeks.  Please also call with any specific questions about appointments or follow up tests.  

## 2021-10-25 NOTE — H&P (View-Only) (Signed)
[facility] Progress Note  Sue Collins 26 y.o. 08/19/1995   Subjective: 26 yo with recurrent epigastric pain, nausea, heartburn, and constipation. All of her symptoms that were occurring in the spring and summer have resolved and only has rare heartburn and nausea now. Attempted EGD in the summer was not successful due to retching and gagging during sedation and the procedure was not attempted. Takes Omeprazole 40 mg/day. UGIS showed mildly irregular appearance of the gastric folds. No history of rectal bleeding or black stools.  Objective: Vital signs: Vitals:   10/25/21 1103  BP: 129/71  Pulse: 83  Resp: 13  Temp: 98.1 F (36.7 C)  SpO2: 98%    Physical Exam: Gen: alert, no acute distress, obese, pleasant HEENT: anicteric sclera CV: RRR Chest: CTA B Abd: soft, nontender, nondistended, +BS Ext: no edema Psych: normal mood, affect  Lab Results: No results for input(s): "NA", "K", "CL", "CO2", "GLUCOSE", "BUN", "CREATININE", "CALCIUM", "MG", "PHOS" in the last 72 hours. No results for input(s): "AST", "ALT", "ALKPHOS", "BILITOT", "PROT", "ALBUMIN" in the last 72 hours. No results for input(s): "WBC", "NEUTROABS", "HGB", "HCT", "MCV", "PLT" in the last 72 hours.    Assessment/Plan: 26 yo with GERD and history of nausea with an abnormal UGIS that showed mildly irregular appearance of the gastric folds likely due to underdistention but needs an EGD to further evaluate. Suspect GERD and/or dyspepsia as source of her epigastric pain and nausea.   Cranford Blessinger C Verona Hartshorn 10/25/2021, 12:13 PM  Questions please call [phone] Patient ID: Sue Collins, female   DOB: 10/07/1995, 26 y.o.   MRN: [id number]  

## 2021-10-25 NOTE — Interval H&P Note (Signed)
History and Physical Interval Note:  10/25/2021 12:19 PM  Sue Collins  has presented today for surgery, with the diagnosis of Abnormal UGI series.  The various methods of treatment have been discussed with the patient and family. After consideration of risks, benefits and other options for treatment, the patient has consented to  Procedure(s): ESOPHAGOGASTRODUODENOSCOPY (EGD) WITH PROPOFOL (N/A) as a surgical intervention.  The patient's history has been reviewed, patient examined, no change in status, stable for surgery.  I have reviewed the patient's chart and labs.  Questions were answered to the patient's satisfaction.     Lear Ng

## 2021-10-25 NOTE — Transfer of Care (Signed)
Immediate Anesthesia Transfer of Care Note  Patient: Sue Collins  Procedure(s) Performed: ESOPHAGOGASTRODUODENOSCOPY (EGD) WITH PROPOFOL BIOPSY  Patient Location: PACU and Endoscopy Unit  Anesthesia Type:MAC  Level of Consciousness: awake  Airway & Oxygen Therapy: Patient Spontanous Breathing and Patient connected to face mask oxygen  Post-op Assessment: Report given to RN and Post -op Vital signs reviewed and stable  Post vital signs: Reviewed and stable  Last Vitals:  Vitals Value Taken Time  BP 134/80 10/25/21 1241  Temp 36.3 C 10/25/21 1241  Pulse 76 10/25/21 1242  Resp 22 10/25/21 1242  SpO2 100 % 10/25/21 1242  Vitals shown include unvalidated device data.  Last Pain:  Vitals:   10/25/21 1241  TempSrc: Temporal  PainSc: 0-No pain         Complications: No notable events documented.

## 2021-10-25 NOTE — Anesthesia Procedure Notes (Signed)
Date/Time: 10/25/2021 12:15 PM  Performed by: Cynda Familia, CRNAPre-anesthesia Checklist: Patient identified, Emergency Drugs available, Suction available, Patient being monitored and Timeout performed Patient Re-evaluated:Patient Re-evaluated prior to induction Oxygen Delivery Method: Simple face mask Placement Confirmation: positive ETCO2 and breath sounds checked- equal and bilateral Dental Injury: Teeth and Oropharynx as per pre-operative assessment

## 2021-10-26 DIAGNOSIS — F332 Major depressive disorder, recurrent severe without psychotic features: Secondary | ICD-10-CM | POA: Diagnosis not present

## 2021-10-26 LAB — SURGICAL PATHOLOGY

## 2021-10-26 NOTE — Anesthesia Postprocedure Evaluation (Signed)
Anesthesia Post Note  Patient: Carrin Vannostrand  Procedure(s) Performed: ESOPHAGOGASTRODUODENOSCOPY (EGD) WITH PROPOFOL BIOPSY     Patient location during evaluation: PACU Anesthesia Type: MAC Level of consciousness: awake and alert Pain management: pain level controlled Vital Signs Assessment: post-procedure vital signs reviewed and stable Respiratory status: spontaneous breathing, nonlabored ventilation, respiratory function stable and patient connected to nasal cannula oxygen Cardiovascular status: stable and blood pressure returned to baseline Postop Assessment: no apparent nausea or vomiting Anesthetic complications: no   No notable events documented.  Last Vitals:  Vitals:   10/25/21 1251 10/25/21 1301  BP: 133/71 114/69  Pulse: 74 83  Resp: 19 20  Temp:    SpO2: 100% 98%    Last Pain:  Vitals:   10/25/21 1301  TempSrc:   PainSc: 0-No pain                 Tiajuana Amass

## 2021-10-27 ENCOUNTER — Encounter (HOSPITAL_COMMUNITY): Payer: Self-pay | Admitting: Gastroenterology

## 2021-11-07 DIAGNOSIS — F332 Major depressive disorder, recurrent severe without psychotic features: Secondary | ICD-10-CM | POA: Diagnosis not present

## 2021-11-14 DIAGNOSIS — F332 Major depressive disorder, recurrent severe without psychotic features: Secondary | ICD-10-CM | POA: Diagnosis not present

## 2021-11-22 DIAGNOSIS — F332 Major depressive disorder, recurrent severe without psychotic features: Secondary | ICD-10-CM | POA: Diagnosis not present

## 2021-11-25 DIAGNOSIS — F3181 Bipolar II disorder: Secondary | ICD-10-CM | POA: Diagnosis not present

## 2021-11-25 DIAGNOSIS — F900 Attention-deficit hyperactivity disorder, predominantly inattentive type: Secondary | ICD-10-CM | POA: Diagnosis not present

## 2021-11-28 DIAGNOSIS — F332 Major depressive disorder, recurrent severe without psychotic features: Secondary | ICD-10-CM | POA: Diagnosis not present

## 2021-12-23 DIAGNOSIS — F900 Attention-deficit hyperactivity disorder, predominantly inattentive type: Secondary | ICD-10-CM | POA: Diagnosis not present

## 2021-12-23 DIAGNOSIS — F3181 Bipolar II disorder: Secondary | ICD-10-CM | POA: Diagnosis not present

## 2022-01-05 DIAGNOSIS — F332 Major depressive disorder, recurrent severe without psychotic features: Secondary | ICD-10-CM | POA: Diagnosis not present

## 2022-01-06 DIAGNOSIS — E559 Vitamin D deficiency, unspecified: Secondary | ICD-10-CM | POA: Diagnosis not present

## 2022-01-19 DIAGNOSIS — F321 Major depressive disorder, single episode, moderate: Secondary | ICD-10-CM | POA: Diagnosis not present

## 2022-01-26 DIAGNOSIS — F321 Major depressive disorder, single episode, moderate: Secondary | ICD-10-CM | POA: Diagnosis not present

## 2022-02-08 DIAGNOSIS — F321 Major depressive disorder, single episode, moderate: Secondary | ICD-10-CM | POA: Diagnosis not present

## 2022-02-14 DIAGNOSIS — F321 Major depressive disorder, single episode, moderate: Secondary | ICD-10-CM | POA: Diagnosis not present

## 2022-02-17 DIAGNOSIS — F3181 Bipolar II disorder: Secondary | ICD-10-CM | POA: Diagnosis not present

## 2022-02-17 DIAGNOSIS — F900 Attention-deficit hyperactivity disorder, predominantly inattentive type: Secondary | ICD-10-CM | POA: Diagnosis not present

## 2022-02-17 DIAGNOSIS — F430 Acute stress reaction: Secondary | ICD-10-CM | POA: Diagnosis not present

## 2022-03-08 DIAGNOSIS — F321 Major depressive disorder, single episode, moderate: Secondary | ICD-10-CM | POA: Diagnosis not present

## 2022-03-20 DIAGNOSIS — F321 Major depressive disorder, single episode, moderate: Secondary | ICD-10-CM | POA: Diagnosis not present

## 2022-04-05 DIAGNOSIS — F321 Major depressive disorder, single episode, moderate: Secondary | ICD-10-CM | POA: Diagnosis not present

## 2022-04-11 ENCOUNTER — Ambulatory Visit
Admission: RE | Admit: 2022-04-11 | Discharge: 2022-04-11 | Disposition: A | Discharge status: Home / Self Care | Payer: BC Managed Care – PPO | Source: Ambulatory Visit | Attending: Family Medicine | Admitting: Family Medicine

## 2022-04-11 ENCOUNTER — Other Ambulatory Visit: Payer: Self-pay | Admitting: Family Medicine

## 2022-04-11 DIAGNOSIS — M533 Sacrococcygeal disorders, not elsewhere classified: Secondary | ICD-10-CM

## 2022-04-11 DIAGNOSIS — Z1159 Encounter for screening for other viral diseases: Secondary | ICD-10-CM | POA: Diagnosis not present

## 2022-04-11 DIAGNOSIS — E538 Deficiency of other specified B group vitamins: Secondary | ICD-10-CM | POA: Diagnosis not present

## 2022-04-11 DIAGNOSIS — H6123 Impacted cerumen, bilateral: Secondary | ICD-10-CM | POA: Diagnosis not present

## 2022-04-11 DIAGNOSIS — E282 Polycystic ovarian syndrome: Secondary | ICD-10-CM | POA: Diagnosis not present

## 2022-04-11 DIAGNOSIS — Z0000 Encounter for general adult medical examination without abnormal findings: Secondary | ICD-10-CM | POA: Diagnosis not present

## 2022-04-11 DIAGNOSIS — E039 Hypothyroidism, unspecified: Secondary | ICD-10-CM | POA: Diagnosis not present

## 2022-04-11 DIAGNOSIS — E559 Vitamin D deficiency, unspecified: Secondary | ICD-10-CM | POA: Diagnosis not present

## 2022-04-12 DIAGNOSIS — K611 Rectal abscess: Secondary | ICD-10-CM | POA: Diagnosis not present

## 2022-04-23 DIAGNOSIS — K611 Rectal abscess: Secondary | ICD-10-CM | POA: Diagnosis not present

## 2022-05-12 DIAGNOSIS — F3181 Bipolar II disorder: Secondary | ICD-10-CM | POA: Diagnosis not present

## 2022-05-12 DIAGNOSIS — F900 Attention-deficit hyperactivity disorder, predominantly inattentive type: Secondary | ICD-10-CM | POA: Diagnosis not present

## 2022-05-12 DIAGNOSIS — F430 Acute stress reaction: Secondary | ICD-10-CM | POA: Diagnosis not present

## 2022-05-20 DIAGNOSIS — H60391 Other infective otitis externa, right ear: Secondary | ICD-10-CM | POA: Diagnosis not present

## 2022-05-30 DIAGNOSIS — F321 Major depressive disorder, single episode, moderate: Secondary | ICD-10-CM | POA: Diagnosis not present

## 2022-07-05 DIAGNOSIS — F321 Major depressive disorder, single episode, moderate: Secondary | ICD-10-CM | POA: Diagnosis not present

## 2022-07-10 DIAGNOSIS — F321 Major depressive disorder, single episode, moderate: Secondary | ICD-10-CM | POA: Diagnosis not present

## 2022-07-21 DIAGNOSIS — Z7184 Encounter for health counseling related to travel: Secondary | ICD-10-CM | POA: Diagnosis not present

## 2022-07-21 DIAGNOSIS — E559 Vitamin D deficiency, unspecified: Secondary | ICD-10-CM | POA: Diagnosis not present

## 2022-07-21 DIAGNOSIS — K219 Gastro-esophageal reflux disease without esophagitis: Secondary | ICD-10-CM | POA: Diagnosis not present

## 2022-08-02 DIAGNOSIS — F321 Major depressive disorder, single episode, moderate: Secondary | ICD-10-CM | POA: Diagnosis not present

## 2022-08-04 DIAGNOSIS — F430 Acute stress reaction: Secondary | ICD-10-CM | POA: Diagnosis not present

## 2022-08-04 DIAGNOSIS — F3181 Bipolar II disorder: Secondary | ICD-10-CM | POA: Diagnosis not present

## 2022-08-04 DIAGNOSIS — F900 Attention-deficit hyperactivity disorder, predominantly inattentive type: Secondary | ICD-10-CM | POA: Diagnosis not present

## 2022-09-15 DIAGNOSIS — F321 Major depressive disorder, single episode, moderate: Secondary | ICD-10-CM | POA: Diagnosis not present

## 2022-10-09 DIAGNOSIS — F321 Major depressive disorder, single episode, moderate: Secondary | ICD-10-CM | POA: Diagnosis not present

## 2022-10-30 DIAGNOSIS — F321 Major depressive disorder, single episode, moderate: Secondary | ICD-10-CM | POA: Diagnosis not present

## 2022-11-09 DIAGNOSIS — F321 Major depressive disorder, single episode, moderate: Secondary | ICD-10-CM | POA: Diagnosis not present

## 2022-11-18 DIAGNOSIS — R051 Acute cough: Secondary | ICD-10-CM | POA: Diagnosis not present

## 2022-11-18 DIAGNOSIS — R0981 Nasal congestion: Secondary | ICD-10-CM | POA: Diagnosis not present

## 2022-11-18 DIAGNOSIS — J029 Acute pharyngitis, unspecified: Secondary | ICD-10-CM | POA: Diagnosis not present

## 2022-11-18 DIAGNOSIS — J040 Acute laryngitis: Secondary | ICD-10-CM | POA: Diagnosis not present

## 2022-11-23 DIAGNOSIS — F321 Major depressive disorder, single episode, moderate: Secondary | ICD-10-CM | POA: Diagnosis not present

## 2022-12-25 DIAGNOSIS — F321 Major depressive disorder, single episode, moderate: Secondary | ICD-10-CM | POA: Diagnosis not present

## 2023-09-10 ENCOUNTER — Other Ambulatory Visit: Payer: Self-pay | Admitting: Nurse Practitioner

## 2023-09-10 DIAGNOSIS — G43909 Migraine, unspecified, not intractable, without status migrainosus: Secondary | ICD-10-CM

## 2023-09-10 DIAGNOSIS — G4489 Other headache syndrome: Secondary | ICD-10-CM

## 2023-09-13 ENCOUNTER — Ambulatory Visit
Admission: RE | Admit: 2023-09-13 | Discharge: 2023-09-13 | Disposition: A | Discharge status: Home / Self Care | Source: Ambulatory Visit | Attending: Nurse Practitioner | Admitting: Nurse Practitioner

## 2023-09-13 DIAGNOSIS — G43909 Migraine, unspecified, not intractable, without status migrainosus: Secondary | ICD-10-CM

## 2023-09-13 DIAGNOSIS — G4489 Other headache syndrome: Secondary | ICD-10-CM

## 2023-12-22 IMAGING — CT CT HEAD W/O CM
3 series · 14 of 47 positions shown, 16 images · non-contrast
Comparison: 12/26/2019

CLINICAL DATA: "Patient reports that she was at work at 0800 and
began having right leg pain and weakness. Patient reports that it
feels like centralized cramping"



[Series 2: head wo · axial · 0.47mm/px · z∈[-184,-59]mm · 8 of 30 slices shown, 10 images]
[im 3/30  brain]
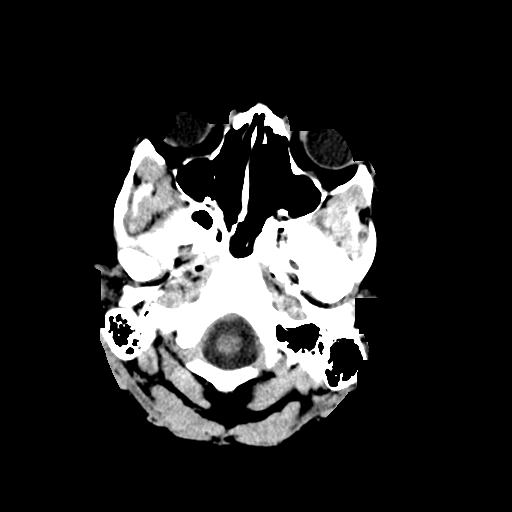
[im 3/30  bone]
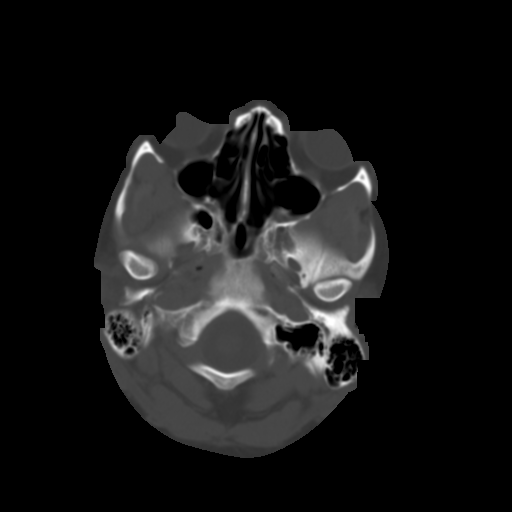
[im 7/30  brain]
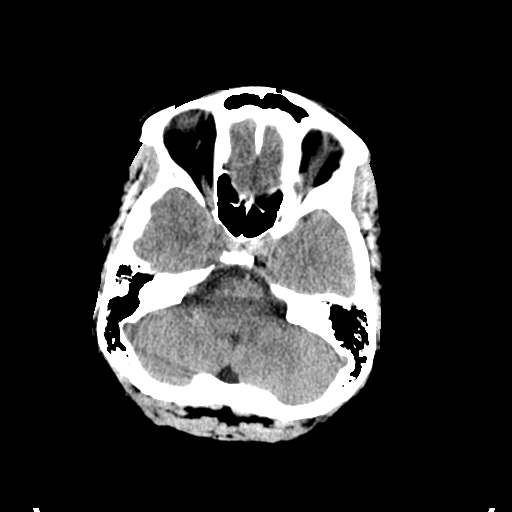
[im 10/30  brain]
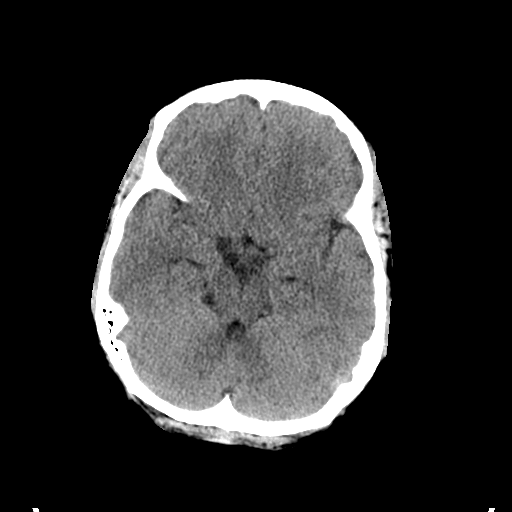
[im 14/30  brain]
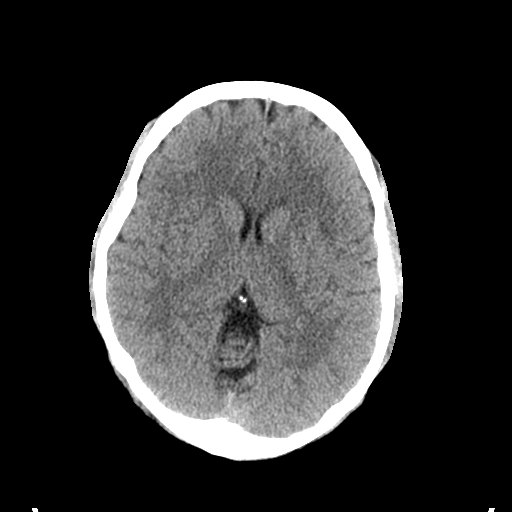
[im 17/30  brain]
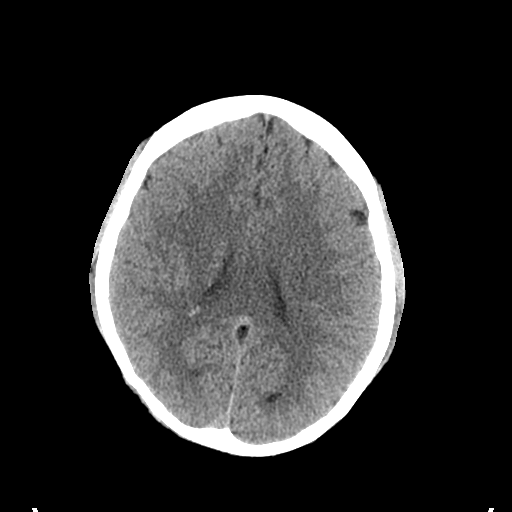
[im 17/30  bone]
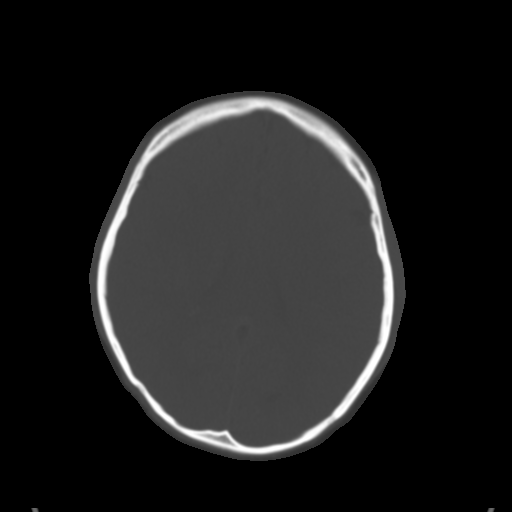
[im 21/30  brain]
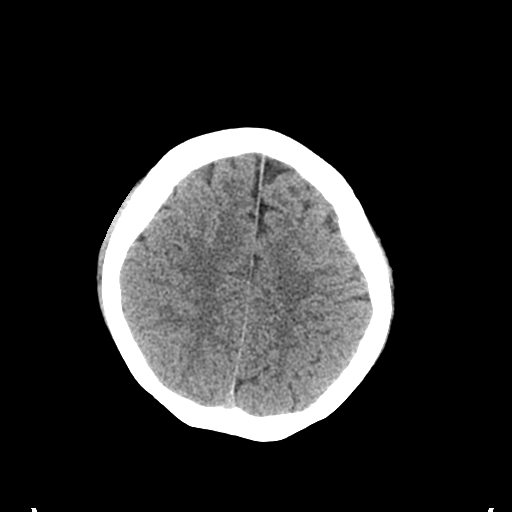
[im 24/30  brain]
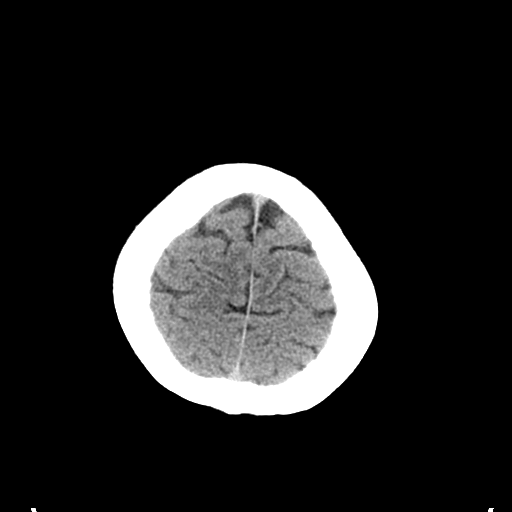
[im 28/30  brain]
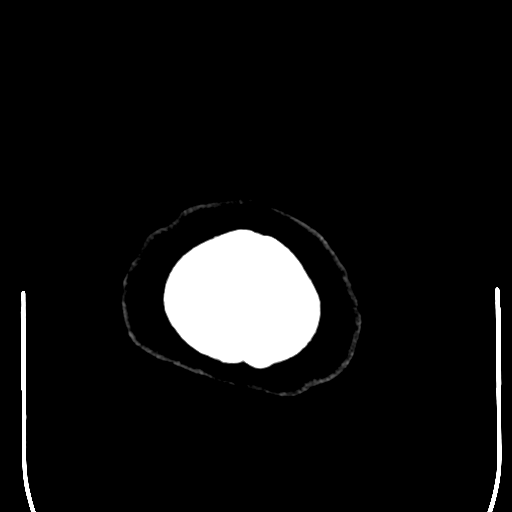

[Series 5: coronal soft tissue · coronal · 0.28mm/px · 3 of 68 slices shown]
[im 23/68  brain]
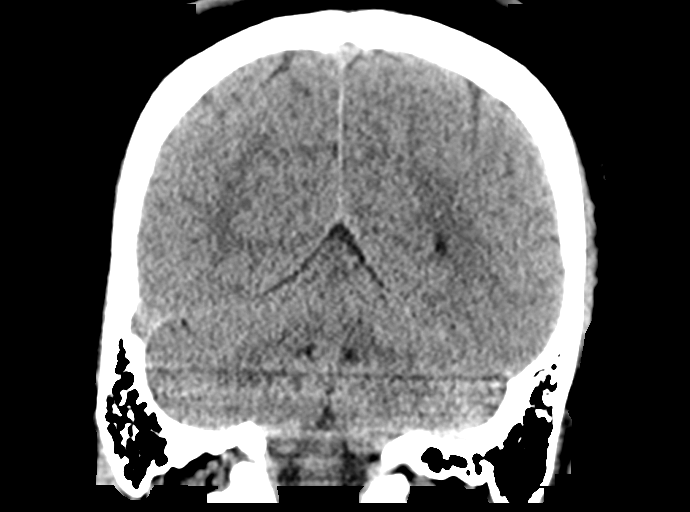
[im 30/68  brain]
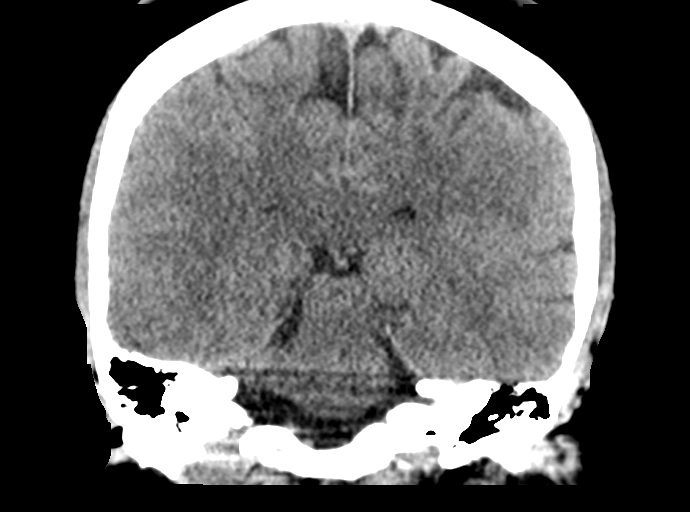
[im 38/68  brain]
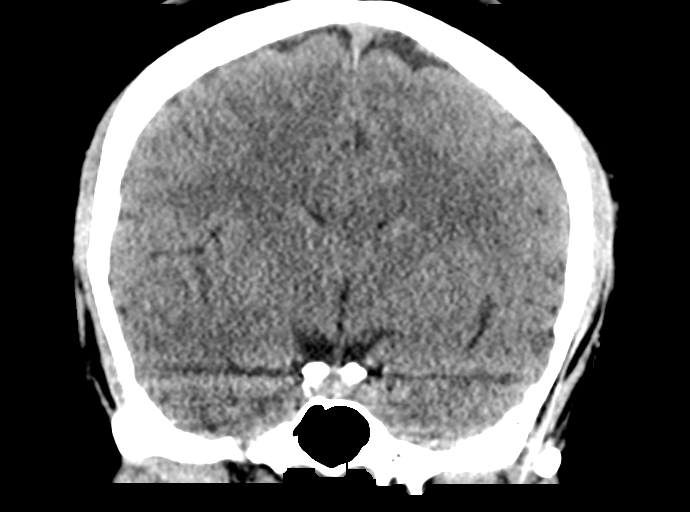

[Series 6: sagittal soft tissue · sagittal · 0.28mm/px · 3 of 64 slices shown]
[im 22/64  brain]
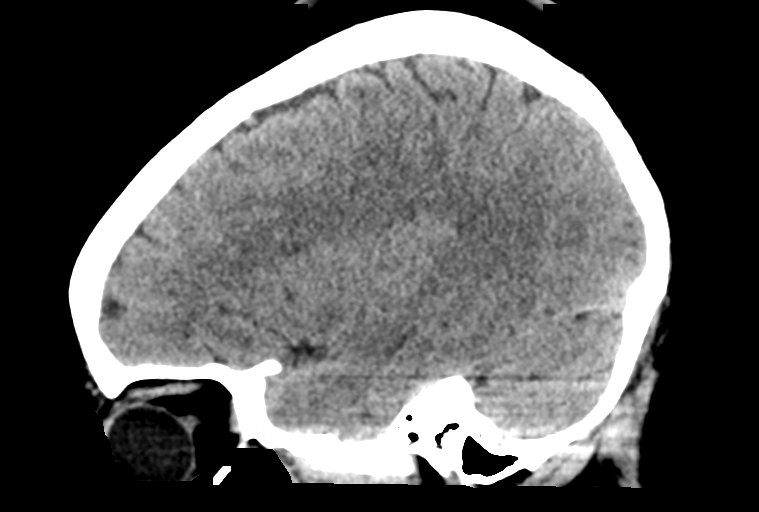
[im 32/64  brain]
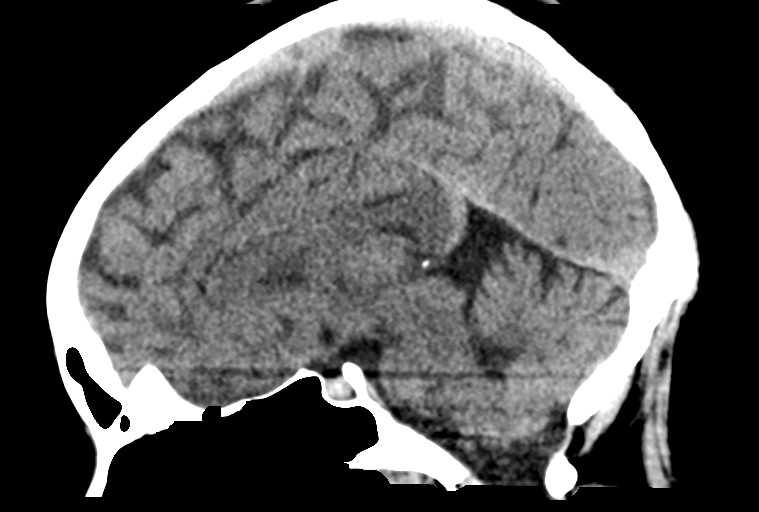
[im 43/64  brain]
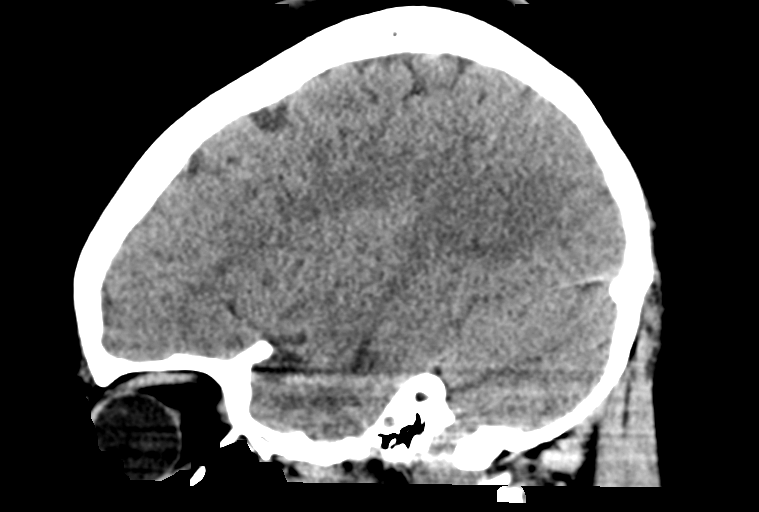

[14 of 47 positions shown; findings below may reference images not displayed]

FINDINGS: Brain: No evidence of acute infarction, hemorrhage, hydrocephalus,
extra-axial collection or mass lesion/mass effect.

Vascular: No hyperdense vessel or unexpected calcification.

Skull: Normal. Negative for fracture or focal lesion.

Sinuses/Orbits: Globes and orbits are within normal limits.
Visualized sinuses are clear.

Other: None.
IMPRESSION: Normal unenhanced CT scan of the brain.  Scan

## 2024-02-15 IMAGING — US US ABDOMEN COMPLETE
1 series · 14 of 25 positions shown · non-contrast
Comparison: None Available.

CLINICAL DATA: Epigastric pain. Indigestion and nausea for 3 weeks.

EXAM:
ABDOMEN ULTRASOUND COMPLETE

[Series 1: us abdomen complete · 0.22mm/px · 14 of 94 slices shown]
[im 1/94]
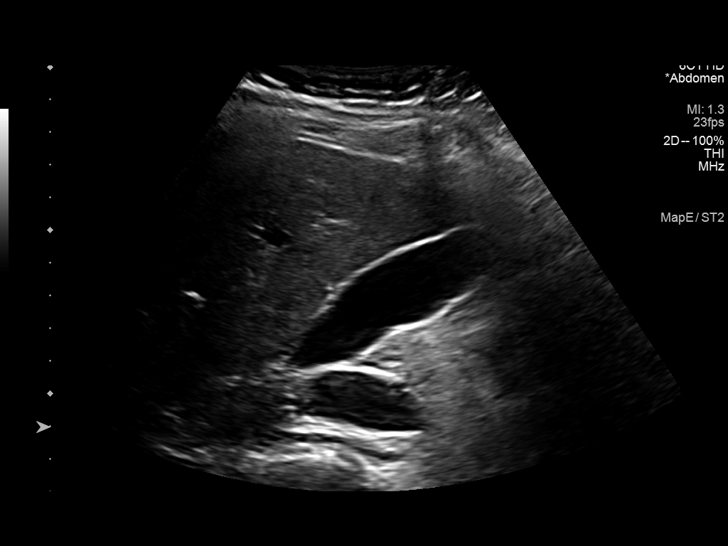
[im 8/94]
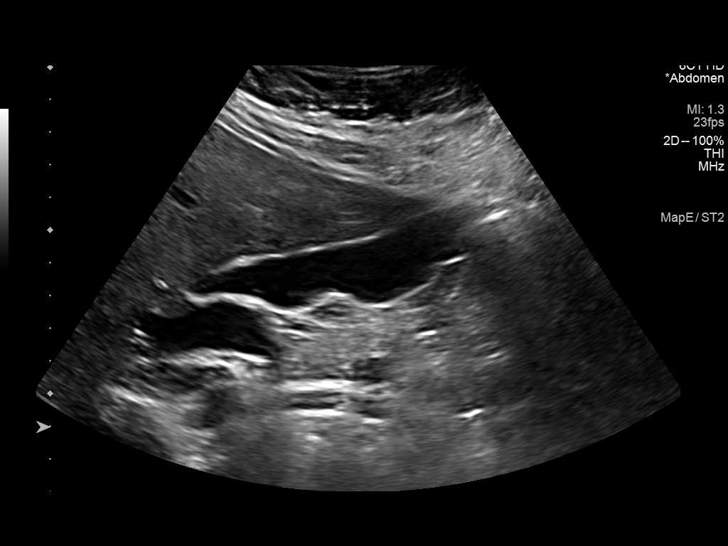
[im 16/94]
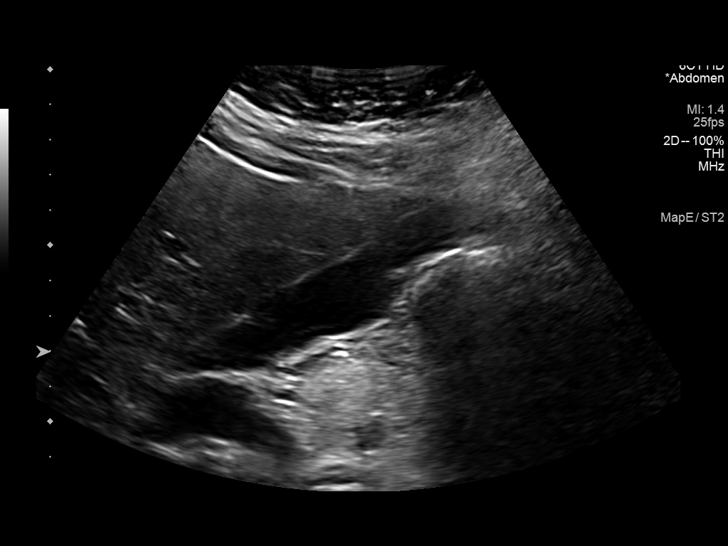
[im 24/94]
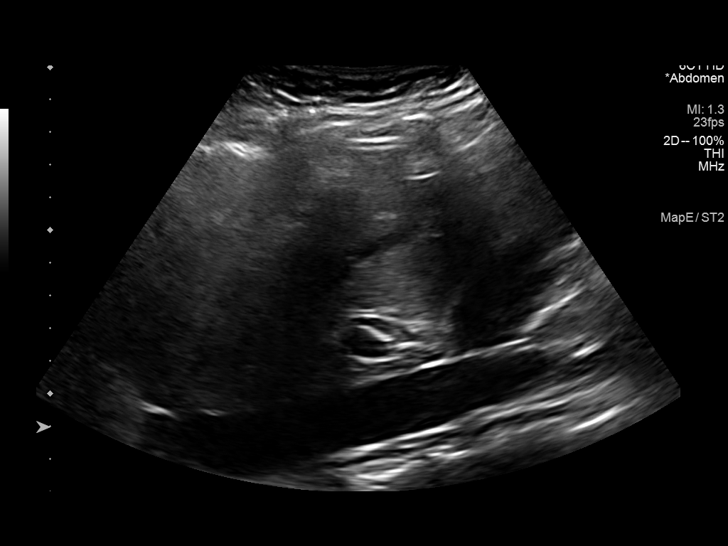
[im 32/94]
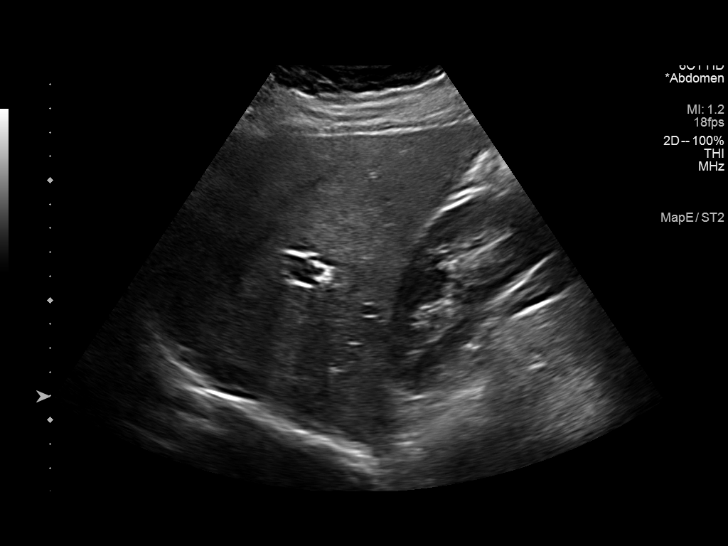
[im 35/94]
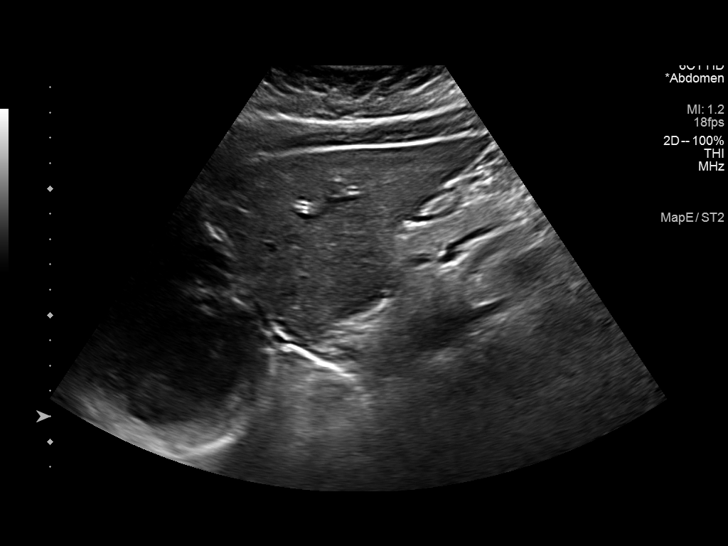
[im 43/94]
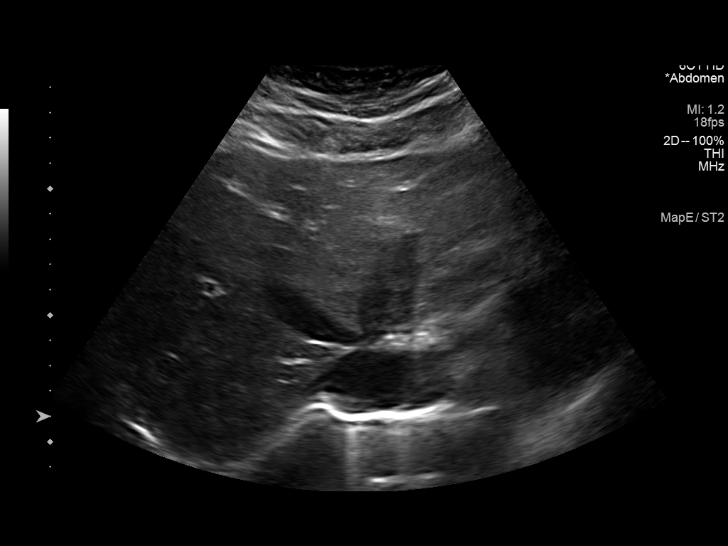
[im 51/94]
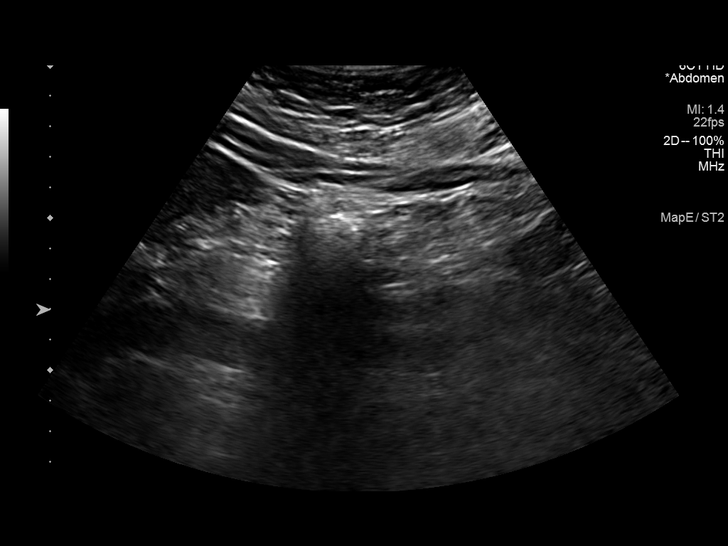
[im 59/94]
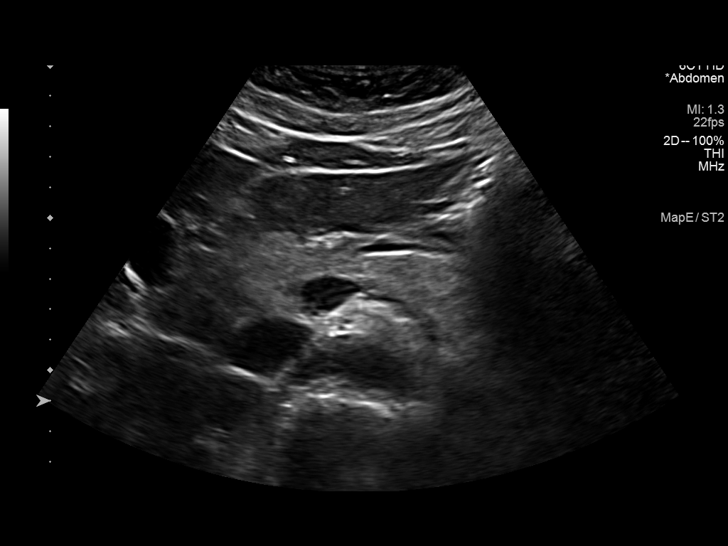
[im 63/94]
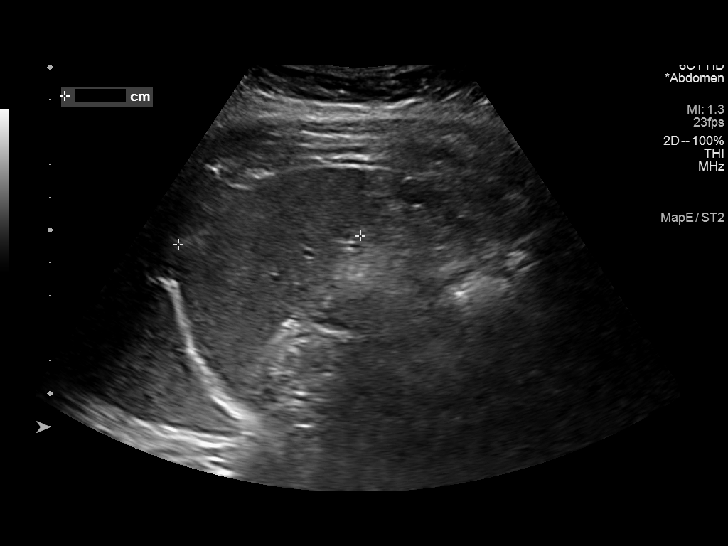
[im 70/94]
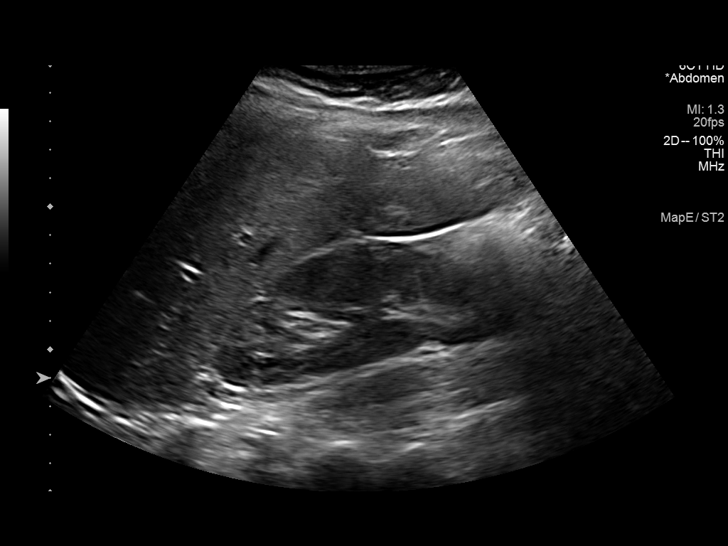
[im 78/94]
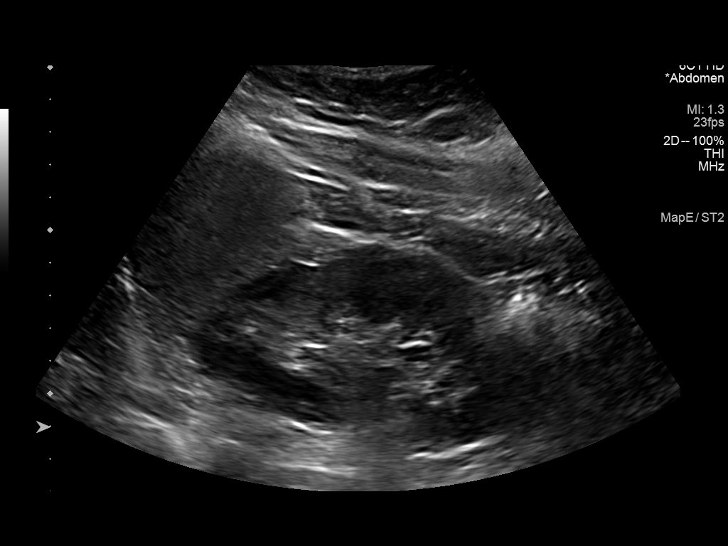
[im 86/94]
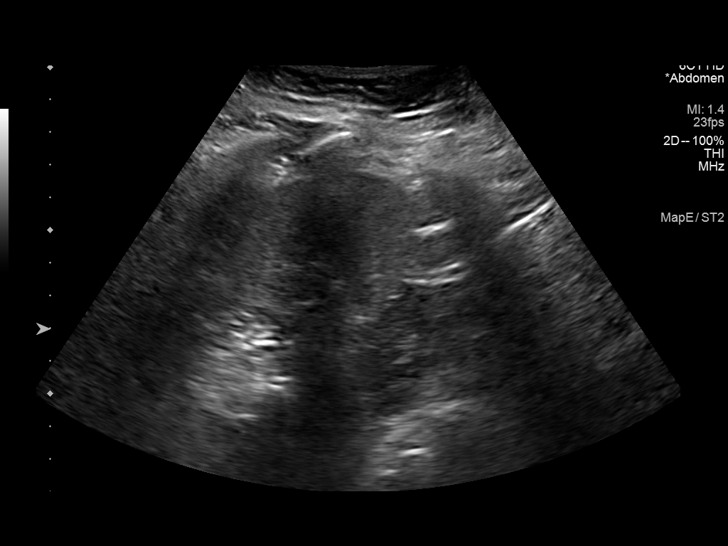
[im 94/94]
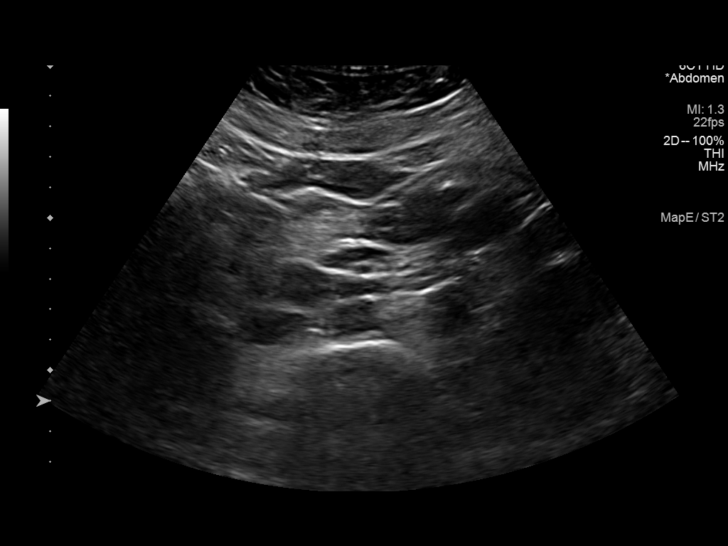

[14 of 25 positions shown; findings below may reference images not displayed]

FINDINGS: Gallbladder: No gallstones or wall thickening visualized. No
sonographic Murphy sign noted by sonographer.

Common bile duct: Diameter: 4.2 mm

Liver: No focal lesion identified. Within normal limits in
parenchymal echogenicity. Portal vein is patent on color Doppler
imaging with normal direction of blood flow towards the liver.

IVC: No abnormality visualized.

Pancreas: Visualized portion unremarkable.

Spleen: Size and appearance within normal limits.

Right Kidney: Length: 11.7 cm. Echogenicity within normal limits. No
mass or hydronephrosis visualized.

Left Kidney: Length: 10.8 cm. Echogenicity within normal limits. No
mass or hydronephrosis visualized.

Abdominal aorta: No aneurysm visualized.

Other findings: None.
IMPRESSION: Normal study.  No cause for symptoms identified.

## 2024-02-15 IMAGING — CR DG ABDOMEN 1V
1 series · 1 of 1 positions shown · non-contrast
Comparison: 03/12/2008

CLINICAL DATA: Nausea, constipation

EXAM:
ABDOMEN - 1 VIEW

[t abdomen supine]
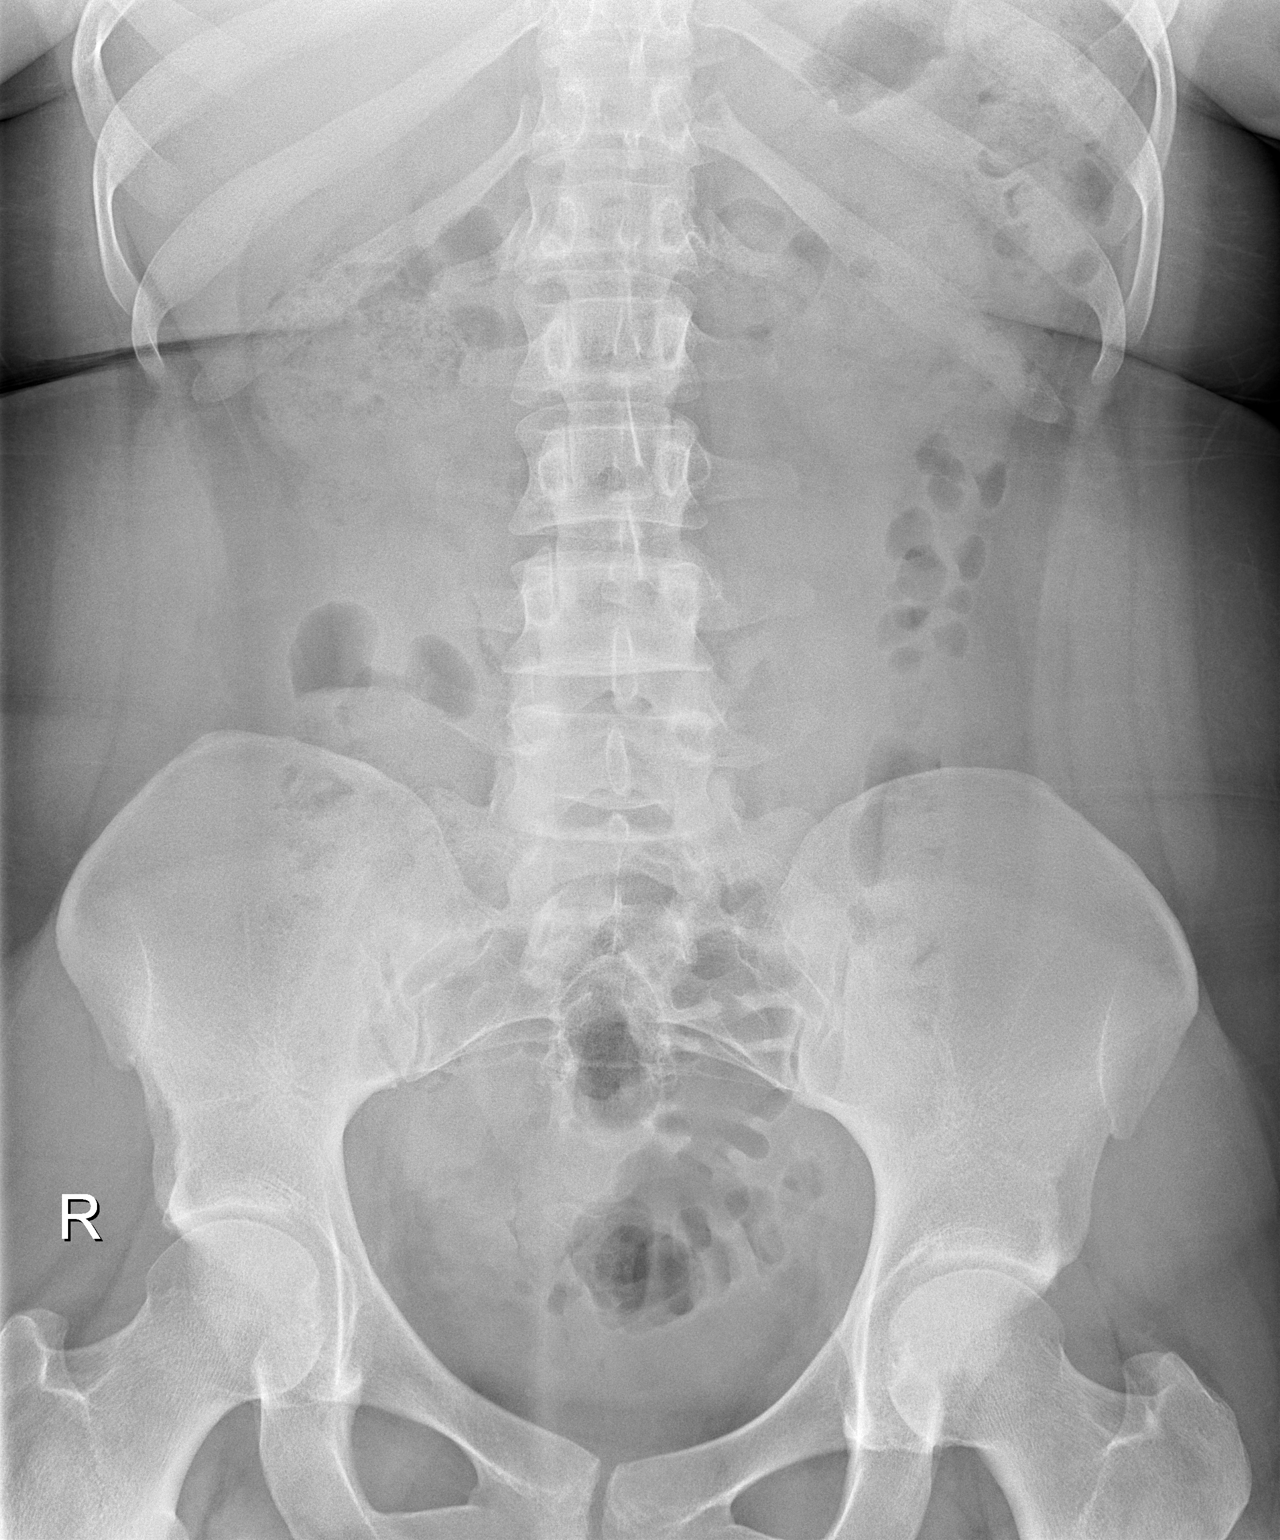

[1 of 1 positions shown; findings below may reference images not displayed]

FINDINGS: Supine frontal view of the abdomen and pelvis excludes
hemidiaphragms by collimation. No bowel obstruction or ileus. No
significant fecal retention. No masses or abnormal calcifications.
No acute bony abnormalities.
IMPRESSION: 1. Unremarkable bowel gas pattern.
